# Patient Record
Sex: Male | Born: 1990 | Race: Black or African American | Hispanic: No | Marital: Single | Smoking: Never smoker
Health system: Southern US, Community
[De-identification: ages and names within clinical notes are randomized; demographics above are authoritative.]

---
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
## 2008-05-13 ENCOUNTER — Emergency Department (HOSPITAL_COMMUNITY): Admission: EM | Admit: 2008-05-13 | Discharge: 2008-05-14 | Payer: Self-pay | Admitting: Emergency Medicine

## 2010-08-03 LAB — COMPREHENSIVE METABOLIC PANEL
AST: 28 U/L (ref 0–37)
Albumin: 4.6 g/dL (ref 3.5–5.2)
Alkaline Phosphatase: 62 U/L (ref 39–117)
Calcium: 10.2 mg/dL (ref 8.4–10.5)
Chloride: 105 mEq/L (ref 96–112)
Creatinine, Ser: 0.97 mg/dL (ref 0.4–1.5)
GFR calc Af Amer: >60 mL/min (ref >60)
Glucose, Bld: 111 mg/dL — ABNORMAL HIGH (ref 70–99)
Total Protein: 7.9 g/dL (ref 6.0–8.3)

## 2010-08-03 LAB — URINALYSIS, ROUTINE W REFLEX MICROSCOPIC
Bilirubin Urine: NEGATIVE
Glucose, UA: NEGATIVE mg/dL
Hgb urine dipstick: NEGATIVE
Ketones, ur: NEGATIVE mg/dL
Protein, ur: NEGATIVE mg/dL
pH: 7 (ref 5.0–8.0)

## 2010-08-03 LAB — CBC
Hemoglobin: 17 g/dL (ref 13.0–17.0)
MCHC: 32.9 g/dL (ref 30.0–36.0)
Platelets: 168 10*3/uL (ref 150–400)
RDW: 13.2 % (ref 11.5–15.5)

## 2010-08-03 LAB — AMYLASE: Amylase: 80 U/L (ref 27–131)

## 2010-08-03 LAB — DIFFERENTIAL
Eosinophils Absolute: 0.1 10*3/uL (ref 0.0–0.7)
Lymphs Abs: 0.6 10*3/uL — ABNORMAL LOW (ref 0.7–4.0)
Monocytes Relative: 3 % (ref 3–12)
Neutrophils Relative %: 92 % — ABNORMAL HIGH (ref 43–77)

## 2014-01-15 ENCOUNTER — Encounter (HOSPITAL_COMMUNITY): Payer: Self-pay | Admitting: Emergency Medicine

## 2014-01-15 ENCOUNTER — Emergency Department (HOSPITAL_COMMUNITY)
Admission: EM | Admit: 2014-01-15 | Discharge: 2014-01-15 | Disposition: A | Discharge status: Home / Self Care | Payer: BC Managed Care – PPO | Attending: Emergency Medicine | Admitting: Emergency Medicine

## 2014-01-15 ENCOUNTER — Emergency Department (HOSPITAL_COMMUNITY): Payer: BC Managed Care – PPO

## 2014-01-15 DIAGNOSIS — R1033 Periumbilical pain: Secondary | ICD-10-CM | POA: Insufficient documentation

## 2014-01-15 DIAGNOSIS — R1013 Epigastric pain: Secondary | ICD-10-CM | POA: Insufficient documentation

## 2014-01-15 DIAGNOSIS — Z791 Long term (current) use of non-steroidal anti-inflammatories (NSAID): Secondary | ICD-10-CM | POA: Insufficient documentation

## 2014-01-15 DIAGNOSIS — R112 Nausea with vomiting, unspecified: Secondary | ICD-10-CM | POA: Diagnosis not present

## 2014-01-15 DIAGNOSIS — Z7982 Long term (current) use of aspirin: Secondary | ICD-10-CM | POA: Diagnosis not present

## 2014-01-15 DIAGNOSIS — R51 Headache: Secondary | ICD-10-CM | POA: Insufficient documentation

## 2014-01-15 LAB — URINALYSIS, ROUTINE W REFLEX MICROSCOPIC
Bilirubin Urine: NEGATIVE
GLUCOSE, UA: 100 mg/dL — AB
HGB URINE DIPSTICK: NEGATIVE
KETONES UR: 15 mg/dL — AB
Leukocytes, UA: NEGATIVE
Nitrite: NEGATIVE
PROTEIN: 30 mg/dL — AB
SPECIFIC GRAVITY, URINE: 1.017 (ref 1.005–1.030)
UROBILINOGEN UA: 2 mg/dL — AB (ref 0.0–1.0)
pH: 8 (ref 5.0–8.0)

## 2014-01-15 LAB — CBC WITH DIFFERENTIAL/PLATELET
BASOS ABS: 0 10*3/uL (ref 0.0–0.1)
Basophils Relative: 0 % (ref 0–1)
EOS ABS: 0.1 10*3/uL (ref 0.0–0.7)
Eosinophils Relative: 1 % (ref 0–5)
HEMATOCRIT: 44.2 % (ref 39.0–52.0)
HEMOGLOBIN: 15.5 g/dL (ref 13.0–17.0)
Lymphocytes Relative: 25 % (ref 12–46)
Lymphs Abs: 2.2 10*3/uL (ref 0.7–4.0)
MCH: 29.6 pg (ref 26.0–34.0)
MCHC: 35.1 g/dL (ref 30.0–36.0)
MCV: 84.4 fL (ref 78.0–100.0)
Monocytes Absolute: 0.4 10*3/uL (ref 0.1–1.0)
Monocytes Relative: 5 % (ref 3–12)
Neutro Abs: 5.8 10*3/uL (ref 1.7–7.7)
Neutrophils Relative %: 69 % (ref 43–77)
PLATELETS: 214 10*3/uL (ref 150–400)
RBC: 5.24 MIL/uL (ref 4.22–5.81)
RDW: 12.7 % (ref 11.5–15.5)
WBC: 8.5 10*3/uL (ref 4.0–10.5)

## 2014-01-15 LAB — COMPREHENSIVE METABOLIC PANEL
ALBUMIN: 4.4 g/dL (ref 3.5–5.2)
ALT: 29 U/L (ref 0–53)
AST: 43 U/L — ABNORMAL HIGH (ref 0–37)
Alkaline Phosphatase: 71 U/L (ref 39–117)
Anion gap: 15 (ref 5–15)
BUN: 8 mg/dL (ref 6–23)
CALCIUM: 10.2 mg/dL (ref 8.4–10.5)
CHLORIDE: 101 meq/L (ref 96–112)
CO2: 25 meq/L (ref 19–32)
CREATININE: 0.88 mg/dL (ref 0.50–1.35)
GFR calc Af Amer: >90 mL/min (ref >90)
GLUCOSE: 96 mg/dL (ref 70–99)
Potassium: 3.4 mEq/L — ABNORMAL LOW (ref 3.7–5.3)
SODIUM: 141 meq/L (ref 137–147)
Total Bilirubin: 1.3 mg/dL — ABNORMAL HIGH (ref 0.3–1.2)
Total Protein: 7.8 g/dL (ref 6.0–8.3)

## 2014-01-15 LAB — URINE MICROSCOPIC-ADD ON

## 2014-01-15 LAB — LIPASE, BLOOD: Lipase: 16 U/L (ref 11–59)

## 2014-01-15 MED ORDER — METOCLOPRAMIDE HCL 5 MG/ML IJ SOLN
10.0000 mg | Freq: Once | INTRAMUSCULAR | Status: AC
  Administered 2014-01-15: 10 mg via INTRAVENOUS
  Filled 2014-01-15: qty 2

## 2014-01-15 MED ORDER — SODIUM CHLORIDE 0.9 % IV BOLUS (SEPSIS)
1000.0000 mL | Freq: Once | INTRAVENOUS | Status: AC
  Administered 2014-01-15: 1000 mL via INTRAVENOUS

## 2014-01-15 MED ORDER — ONDANSETRON HCL 4 MG/2ML IJ SOLN
4.0000 mg | Freq: Once | INTRAMUSCULAR | Status: AC
  Administered 2014-01-15: 4 mg via INTRAVENOUS
  Filled 2014-01-15: qty 2

## 2014-01-15 MED ORDER — DIPHENHYDRAMINE HCL 50 MG/ML IJ SOLN
25.0000 mg | Freq: Once | INTRAMUSCULAR | Status: AC
  Administered 2014-01-15: 25 mg via INTRAVENOUS
  Filled 2014-01-15: qty 1

## 2014-01-15 MED ORDER — IOHEXOL 300 MG/ML  SOLN
25.0000 mL | Freq: Once | INTRAMUSCULAR | Status: AC | PRN
  Administered 2014-01-15: 25 mL via ORAL

## 2014-01-15 MED ORDER — IOHEXOL 300 MG/ML  SOLN
100.0000 mL | Freq: Once | INTRAMUSCULAR | Status: AC | PRN
  Administered 2014-01-15: 100 mL via INTRAVENOUS

## 2014-01-15 MED ORDER — ONDANSETRON 4 MG PO TBDP: 4.0000 mg | ORAL_TABLET | Freq: Three times a day (TID) | ORAL | Status: DC | PRN

## 2014-01-15 NOTE — ED Notes (Signed)
Pt tolerated ginger ale.  

## 2014-01-15 NOTE — ED Notes (Signed)
Pt given ginger ale per oders.

## 2014-01-15 NOTE — Discharge Instructions (Signed)
Please follow up with your primary care physician in 1-2 days. If you do not have one please call the [facility] number listed above. Please take Zofran as prescribed. Please read all discharge instructions and return precautions.   Nausea and Vomiting Nausea is a sick feeling that often comes before throwing up (vomiting). Vomiting is a reflex where stomach contents come out of your mouth. Vomiting can cause severe loss of body fluids (dehydration). Children and elderly adults can become dehydrated quickly, especially if they also have diarrhea. Nausea and vomiting are symptoms of a condition or disease. It is important to find the cause of your symptoms. CAUSES   Direct irritation of the stomach lining. This irritation can result from increased acid production (gastroesophageal reflux disease), infection, food poisoning, taking certain medicines (such as nonsteroidal anti-inflammatory drugs), alcohol use, or tobacco use.  Signals from the brain.These signals could be caused by a headache, heat exposure, an inner ear disturbance, increased pressure in the brain from injury, infection, a tumor, or a concussion, pain, emotional stimulus, or metabolic problems.  An obstruction in the gastrointestinal tract (bowel obstruction).  Illnesses such as diabetes, hepatitis, gallbladder problems, appendicitis, kidney problems, cancer, sepsis, atypical symptoms of a heart attack, or eating disorders.  Medical treatments such as chemotherapy and radiation.  Receiving medicine that makes you sleep (general anesthetic) during surgery. DIAGNOSIS Your caregiver may ask for tests to be done if the problems do not improve after a few days. Tests may also be done if symptoms are severe or if the reason for the nausea and vomiting is not clear. Tests may include:  Urine tests.  Blood tests.  Stool tests.  Cultures (to look for evidence of infection).  X-rays or other imaging  studies. Test results can help your caregiver make decisions about treatment or the need for additional tests. TREATMENT You need to stay well hydrated. Drink frequently but in small amounts.You may wish to drink water, sports drinks, clear broth, or eat frozen ice pops or gelatin dessert to help stay hydrated.When you eat, eating slowly may help prevent nausea.There are also some antinausea medicines that may help prevent nausea. HOME CARE INSTRUCTIONS   Take all medicine as directed by your caregiver.  If you do not have an appetite, do not force yourself to eat. However, you must continue to drink fluids.  If you have an appetite, eat a normal diet unless your caregiver tells you differently.  Eat a variety of complex carbohydrates (rice, wheat, potatoes, bread), lean meats, yogurt, fruits, and vegetables.  Avoid high-fat foods because they are more difficult to digest.  Drink enough water and fluids to keep your urine clear or pale yellow.  If you are dehydrated, ask your caregiver for specific rehydration instructions. Signs of dehydration may include:  Severe thirst.  Dry lips and mouth.  Dizziness.  Dark urine.  Decreasing urine frequency and amount.  Confusion.  Rapid breathing or pulse. SEEK IMMEDIATE MEDICAL CARE IF:   You have blood or brown flecks (like coffee grounds) in your vomit.  You have black or bloody stools.  You have a severe headache or stiff neck.  You are confused.  You have severe abdominal pain.  You have chest pain or trouble breathing.  You do not urinate at least once every 8 hours.  You develop cold or clammy skin.  You continue to vomit for longer than 24 to 48 hours.  You have a fever. MAKE SURE YOU:  Understand these instructions.  Will watch your condition.  Will get help right away if you are not doing well or get worse. Document Released: 04/05/2005 Document Revised: 06/28/2011 Document Reviewed:  09/02/2010 [facility] LPExitCare Patient Information 2015 [place], [place]. This information is not intended to replace advice given to you by your health care provider. Make sure you discuss any questions you have with your health care provider.   Abdominal Pain Many things can cause abdominal pain. Usually, abdominal pain is not caused by a disease and will improve without treatment. It can often be observed and treated at home. Your health care provider will do a physical exam and possibly order blood tests and X-rays to help determine the seriousness of your pain. However, in many cases, more time must pass before a clear cause of the pain can be found. Before that point, your health care provider may not know if you need more testing or further treatment. HOME CARE INSTRUCTIONS  Monitor your abdominal pain for any changes. The following actions may help to alleviate any discomfort you are experiencing:  Only take over-the-counter or prescription medicines as directed by your health care provider.  Do not take laxatives unless directed to do so by your health care provider.  Try a clear liquid diet (broth, tea, or water) as directed by your health care provider. Slowly move to a bland diet as tolerated. SEEK MEDICAL CARE IF:  You have unexplained abdominal pain.  You have abdominal pain associated with nausea or diarrhea.  You have pain when you urinate or have a bowel movement.  You experience abdominal pain that wakes you in the night.  You have abdominal pain that is worsened or improved by eating food.  You have abdominal pain that is worsened with eating fatty foods.  You have a fever. SEEK IMMEDIATE MEDICAL CARE IF:   Your pain does not go away within 2 hours.  You keep throwing up (vomiting).  Your pain is felt only in portions of the abdomen, such as the right side or the left lower portion of the abdomen.  You pass bloody or black tarry stools. MAKE SURE YOU:  Understand these  instructions.   Will watch your condition.   Will get help right away if you are not doing well or get worse.  Document Released: 01/13/2005 Document Revised: 04/10/2013 Document Reviewed: 12/13/2012 [facility] Patient Information 2015 [employer], [place]. This information is not intended to replace advice given to you by your health care provider. Make sure you discuss any questions you have with your health care provider.

## 2014-01-15 NOTE — ED Notes (Signed)
Pt c/o N/v x 4 days; pt sts HA worse with bright light as well

## 2014-01-15 NOTE — ED Provider Notes (Signed)
CSN: [id number]     Arrival date & time 01/15/14  1250 History   First MD Initiated Contact with Patient 01/15/14 1514     Chief Complaint  Patient presents with  . Emesis  . Headache     (Consider location/radiation/quality/duration/timing/severity/associated sxs/prior Treatment) HPI Comments: Patient is an otherwise healthy 23 year old male presenting to the emergency room for 4 days of nausea, multiple episodes of nonbloody nonbilious emesis with intermittent episodes of generalized tonic headache with associated photophobia. Patient is not currently complaining of a headache at this time. He states he has been unable to tolerate any by mouth intake. This been unable to tolerate any medications. Denies any fevers, chills, diarrhea constipation, urinary symptoms. No abdominal surgical history.   Patient is a 23 y.o. male presenting with vomiting and headaches.  Emesis Associated symptoms: abdominal pain and headaches   Associated symptoms: no chills   Headache Associated symptoms: abdominal pain, nausea and vomiting   Associated symptoms: no fever     History reviewed. No pertinent past medical history. History reviewed. No pertinent past surgical history. History reviewed. No pertinent family history. History  Substance Use Topics  . Smoking status: Never Smoker   . Smokeless tobacco: Not on file  . Alcohol Use: No    Review of Systems  Constitutional: Negative for fever and chills.  Gastrointestinal: Positive for nausea, vomiting and abdominal pain.  Neurological: Positive for headaches.  All other systems reviewed and are negative.     Allergies  Review of patient's allergies indicates no known allergies.  Home Medications   Prior to Admission medications   Medication Sig Start Date End Date Taking? Authorizing Provider  ASPIRIN PO Take 2 tablets by mouth daily as needed (for pain).   Yes Historical Provider, MD  ibuprofen (ADVIL,MOTRIN) 200 MG tablet Take 400 mg  by mouth every 6 (six) hours as needed for moderate pain.   Yes Historical Provider, MD  ondansetron (ZOFRAN ODT) 4 MG disintegrating tablet Take 1 tablet (4 mg total) by mouth every 8 (eight) hours as needed for nausea or vomiting. 01/15/14   Victorino Dike L Oleva Koo, PA-C   BP 119/75  Pulse 63  Temp(Src) 98.5 F (36.9 C) (Oral)  Resp 17  Ht 5\' 2"  (1.575 m)  Wt 134 lb (60.782 kg)  BMI 24.50 kg/m2  SpO2 100% Physical Exam  Nursing note and vitals reviewed. Constitutional: He is oriented to person, place, and time. He appears well-developed and well-nourished. No distress.  HENT:  Head: Normocephalic and atraumatic.  Right Ear: External ear normal.  Left Ear: External ear normal.  Nose: Nose normal.  Mouth/Throat: Oropharynx is clear and moist. No oropharyngeal exudate.  Eyes: Conjunctivae and EOM are normal. Pupils are equal, round, and reactive to light.  Neck: Normal range of motion. Neck supple.  Cardiovascular: Normal rate, regular rhythm, normal heart sounds and intact distal pulses.   Pulmonary/Chest: Effort normal and breath sounds normal. No respiratory distress.  Abdominal: Soft. Normal appearance and bowel sounds are normal. He exhibits no distension. There is tenderness in the epigastric area and periumbilical area. There is no rigidity, no rebound, no guarding and no CVA tenderness.  Musculoskeletal: Normal range of motion. He exhibits no edema.  Neurological: He is alert and oriented to person, place, and time. He has normal strength. No cranial nerve deficit. Gait normal. GCS eye subscore is 4. GCS verbal subscore is 5. GCS motor subscore is 6.  Sensation grossly intact.  No pronator drift.  Bilateral heel-knee-shin intact.  Skin: Skin is warm and dry. He is not diaphoretic.    ED Course  Procedures (including critical care time) Medications  sodium chloride 0.9 % bolus 1,000 mL (0 mLs Intravenous Stopped 01/15/14 1831)  ondansetron (ZOFRAN) injection 4 mg (4 mg  Intravenous Given 01/15/14 1639)  metoCLOPramide (REGLAN) injection 10 mg (10 mg Intravenous Given 01/15/14 1641)  diphenhydrAMINE (BENADRYL) injection 25 mg (25 mg Intravenous Given 01/15/14 1640)  iohexol (OMNIPAQUE) 300 MG/ML solution 25 mL (25 mLs Oral Contrast Given 01/15/14 1635)  iohexol (OMNIPAQUE) 300 MG/ML solution 100 mL (100 mLs Intravenous Contrast Given 01/15/14 1800)    Labs Review Labs Reviewed  COMPREHENSIVE METABOLIC PANEL - Abnormal; Notable for the following:    Potassium 3.4 (*)    AST 43 (*)    Total Bilirubin 1.3 (*)    All other components within normal limits  URINALYSIS, ROUTINE W REFLEX MICROSCOPIC - Abnormal; Notable for the following:    APPearance HAZY (*)    Glucose, UA 100 (*)    Ketones, ur 15 (*)    Protein, ur 30 (*)    Urobilinogen, UA 2.0 (*)    All other components within normal limits  URINE MICROSCOPIC-ADD ON - Abnormal; Notable for the following:    Bacteria, UA MANY (*)    All other components within normal limits  URINE CULTURE  CBC WITH DIFFERENTIAL  LIPASE, BLOOD    Imaging Review Ct Abdomen Pelvis W Contrast  01/15/2014   CLINICAL DATA:  Nausea and vomiting for 4 days.  Headache.  EXAM: CT ABDOMEN AND PELVIS WITH CONTRAST  TECHNIQUE: Multidetector CT imaging of the abdomen and pelvis was performed using the standard protocol following bolus administration of intravenous contrast.  CONTRAST:  100mL OMNIPAQUE IOHEXOL 300 MG/ML  SOLN  COMPARISON:  Abdominal pelvic CT 05/13/2008.  FINDINGS: Lung bases: Clear. No significant pleural or pericardial effusion.  Liver/Biliary/Pancreas: The liver is normal in density without focal abnormality. No evidence of gallstones, gallbladder wall thickening or biliary dilatation. The pancreas appears normal.  Spleen/Adrenal glands: Unremarkable.  Kidneys/Ureters/Bladder: Both kidneys appear normal. There is no hydronephrosis or evidence of urinary tract calculus. The bladder appears normal.  Bowel/Peritoneum: The  stomach, small bowel, appendix and colon demonstrate no significant findings. There is no ascites or extraluminal fluid collection.  Retroperitoneum/Pelvis: There are no enlarged abdominal or pelvic lymph nodes. Small mesenteric lymph nodes are likely reactive. There are no significant vascular findings. The prostate gland and seminal vesicles appear normal.  Abdominal wall: No abdominal wall masses or hernias.  Musculoskeletal: No acute or significant osseus findings.  IMPRESSION: 1. No definite acute abdominal pelvic findings. No evidence of appendicitis or bowel obstruction. 2. Mildly prominent mesenteric lymph nodes, potentially reflecting mild mesenteric adenitis.   Electronically Signed   By: Roxy HorsemanBill  Veazey M.D.   On: 01/15/2014 18:22     EKG Interpretation None      MDM   Final diagnoses:  Nausea and vomiting in adult patient  Periumbilical abdominal pain   Filed Vitals:   01/15/14 1930  BP: 119/75  Pulse: 63  Temp:   Resp: 17   Afebrile, NAD, non-toxic appearing, AAOx4. Patient is nontoxic, nonseptic appearing, in no apparent distress.  Patient's pain and other symptoms adequately managed in emergency department.  Fluid bolus given.  Labs, imaging and vitals reviewed.  Patient does not meet the SIRS or Sepsis criteria.  On repeat exam patient does not have a surgical abdomin and there are no peritoneal signs.  No indication  of appendicitis, bowel obstruction, bowel perforation, cholecystitis, diverticulitis.  Patient discharged home with symptomatic treatment and given strict instructions for follow-up with their primary care physician.  I have also discussed reasons to return immediately to the ER.  Patient expresses understanding and agrees with plan. Patient is stable at time of discharge         Jeannetta Ellis, PA-C 01/15/14 2025

## 2014-01-16 LAB — URINE CULTURE
Colony Count: NO GROWTH
Culture: NO GROWTH

## 2014-01-16 NOTE — ED Provider Notes (Signed)
Medical screening examination/treatment/procedure(s) were performed by non-physician practitioner and as supervising physician I was immediately available for consultation/collaboration.    Vida RollerBrian D Meshia Rau, MD 01/16/14 [phone]

## 2014-07-06 ENCOUNTER — Emergency Department (HOSPITAL_COMMUNITY): Payer: Self-pay

## 2014-07-06 ENCOUNTER — Encounter (HOSPITAL_COMMUNITY): Payer: Self-pay

## 2014-07-06 ENCOUNTER — Emergency Department (HOSPITAL_COMMUNITY)
Admission: EM | Admit: 2014-07-06 | Discharge: 2014-07-06 | Disposition: A | Discharge status: Home / Self Care | Payer: Self-pay | Attending: Emergency Medicine | Admitting: Emergency Medicine

## 2014-07-06 DIAGNOSIS — K047 Periapical abscess without sinus: Secondary | ICD-10-CM | POA: Insufficient documentation

## 2014-07-06 LAB — BASIC METABOLIC PANEL
Anion gap: 10 (ref 5–15)
BUN: 11 mg/dL (ref 6–23)
CALCIUM: 9.3 mg/dL (ref 8.4–10.5)
CHLORIDE: 107 mmol/L (ref 96–112)
CO2: 23 mmol/L (ref 19–32)
CREATININE: 0.78 mg/dL (ref 0.50–1.35)
GFR calc Af Amer: >90 mL/min (ref >90)
GFR calc non Af Amer: >90 mL/min (ref >90)
Glucose, Bld: 93 mg/dL (ref 70–99)
POTASSIUM: 3.6 mmol/L (ref 3.5–5.1)
SODIUM: 140 mmol/L (ref 135–145)

## 2014-07-06 LAB — CBC WITH DIFFERENTIAL/PLATELET
BASOS ABS: 0 10*3/uL (ref 0.0–0.1)
BASOS PCT: 0 % (ref 0–1)
Eosinophils Absolute: 0.1 10*3/uL (ref 0.0–0.7)
Eosinophils Relative: 1 % (ref 0–5)
HCT: 44.1 % (ref 39.0–52.0)
Hemoglobin: 15.6 g/dL (ref 13.0–17.0)
Lymphocytes Relative: 16 % (ref 12–46)
Lymphs Abs: 1.6 10*3/uL (ref 0.7–4.0)
MCH: 29.9 pg (ref 26.0–34.0)
MCHC: 35.4 g/dL (ref 30.0–36.0)
MCV: 84.6 fL (ref 78.0–100.0)
Monocytes Absolute: 0.7 10*3/uL (ref 0.1–1.0)
Monocytes Relative: 7 % (ref 3–12)
NEUTROS ABS: 7.4 10*3/uL (ref 1.7–7.7)
Neutrophils Relative %: 76 % (ref 43–77)
PLATELETS: 160 10*3/uL (ref 150–400)
RBC: 5.21 MIL/uL (ref 4.22–5.81)
RDW: 13.2 % (ref 11.5–15.5)
WBC: 9.8 10*3/uL (ref 4.0–10.5)

## 2014-07-06 MED ORDER — IOHEXOL 300 MG/ML  SOLN
100.0000 mL | Freq: Once | INTRAMUSCULAR | Status: AC | PRN
  Administered 2014-07-06: 100 mL via INTRAVENOUS

## 2014-07-06 MED ORDER — FENTANYL CITRATE 0.05 MG/ML IJ SOLN
100.0000 ug | INTRAMUSCULAR | Status: DC | PRN
  Administered 2014-07-06 (×2): 100 ug via INTRAVENOUS
  Filled 2014-07-06 (×2): qty 2

## 2014-07-06 MED ORDER — FENTANYL CITRATE 0.05 MG/ML IJ SOLN: 10.0000 ug | INTRAMUSCULAR | Status: DC | PRN

## 2014-07-06 MED ORDER — DEXTROSE 5 % IV SOLN
1.0000 g | Freq: Once | INTRAVENOUS | Status: AC
  Administered 2014-07-06: 1 g via INTRAVENOUS
  Filled 2014-07-06: qty 10

## 2014-07-06 MED ORDER — ONDANSETRON HCL 4 MG/2ML IJ SOLN
4.0000 mg | Freq: Once | INTRAMUSCULAR | Status: AC
  Administered 2014-07-06: 4 mg via INTRAVENOUS
  Filled 2014-07-06: qty 2

## 2014-07-06 MED ORDER — SODIUM CHLORIDE 0.9 % IV BOLUS (SEPSIS)
500.0000 mL | Freq: Once | INTRAVENOUS | Status: AC
  Administered 2014-07-06: 500 mL via INTRAVENOUS

## 2014-07-06 MED ORDER — SODIUM CHLORIDE 0.9 % IV SOLN
INTRAVENOUS | Status: DC
  Administered 2014-07-06: 11:00:00 via INTRAVENOUS

## 2014-07-06 MED ORDER — CLINDAMYCIN HCL 300 MG PO CAPS: 300.0000 mg | ORAL_CAPSULE | Freq: Four times a day (QID) | ORAL | Status: AC

## 2014-07-06 MED ORDER — OXYCODONE-ACETAMINOPHEN 5-325 MG PO TABS: 1.0000 | ORAL_TABLET | ORAL | Status: DC | PRN

## 2014-07-06 NOTE — Discharge Instructions (Signed)
Your pain and difficulty swallowing is related to a dental infection.  You will need to see the surgeon, for treatment, in 2 days. Do not drink or eat anything after midnight on Sunday night to prepare for the tooth extraction on Monday.  Here, if needed, for problems such as unable to swallow medications, fever, vomiting, or increasing pain not controlled by your medications.

## 2014-07-06 NOTE — ED Provider Notes (Signed)
CSN: [id number]     Arrival date & time 07/06/14  4098 History   First MD Initiated Contact with Patient 07/06/14 1021     Chief Complaint  Patient presents with  . Jaw Pain     (Consider location/radiation/quality/duration/timing/severity/associated sxs/prior Treatment) HPI   Edwin Miranda is a 24 y.o. male who presents for evaluation of left jaw pain, 3 days, with inability to eat, and pain with opening mouth and swallowing. No recent dental care. He denies fever, chills, nausea, vomiting, weakness or dizziness. There are no other known modifying factors.   History reviewed. No pertinent past medical history. History reviewed. No pertinent past surgical history. History reviewed. No pertinent family history. History  Substance Use Topics  . Smoking status: Never Smoker   . Smokeless tobacco: Not on file  . Alcohol Use: No    Review of Systems  All other systems reviewed and are negative.     Allergies  Review of patient's allergies indicates no known allergies.  Home Medications   Prior to Admission medications   Medication Sig Start Date End Date Taking? Authorizing Provider  ibuprofen (ADVIL,MOTRIN) 200 MG tablet Take 400 mg by mouth every 6 (six) hours as needed for moderate pain.   Yes Historical Provider, MD  clindamycin (CLEOCIN) 300 MG capsule Take 1 capsule (300 mg total) by mouth 4 (four) times daily. X 7 days 07/06/14   Mancel Bale, MD  ondansetron (ZOFRAN ODT) 4 MG disintegrating tablet Take 1 tablet (4 mg total) by mouth every 8 (eight) hours as needed for nausea or vomiting. Patient not taking: Reported on 07/06/2014 01/15/14   Francee Piccolo, PA-C  oxyCODONE-acetaminophen (PERCOCET) 5-325 MG per tablet Take 1 tablet by mouth every 4 (four) hours as needed for severe pain. 07/06/14   Mancel Bale, MD   BP 123/73 mmHg  Pulse 70  Temp(Src) 98.9 F (37.2 C) (Oral)  Resp 16  SpO2 99% Physical Exam  Constitutional: He is oriented to person, place,  and time. He appears well-developed and well-nourished.  HENT:  Head: Normocephalic and atraumatic.  Right Ear: External ear normal.  Left Ear: External ear normal.  He has mild trismus. However, with encouragement, he is able to open his mouth, about 50%. There is mild left pharyngeal swelling. No tonsillar hypertrophy or exudate. Left lower molar is carious, to the base, with mild localized swelling, of the alveolar ridge. There is tenderness of the left mandibular region, with indistinct swelling, primarily beneath the mandible. There is no significant palpable mandibular region adenopathy on the left.  Eyes: Conjunctivae and EOM are normal. Pupils are equal, round, and reactive to light.  Neck: Normal range of motion and phonation normal. Neck supple.  Cardiovascular: Normal rate, regular rhythm and normal heart sounds.   Pulmonary/Chest: Effort normal and breath sounds normal. He exhibits no bony tenderness.  Abdominal: Soft. There is no tenderness.  Musculoskeletal: Normal range of motion.  Neurological: He is alert and oriented to person, place, and time. No cranial nerve deficit or sensory deficit. He exhibits normal muscle tone. Coordination normal.  Skin: Skin is warm, dry and intact.  Psychiatric: He has a normal mood and affect. His behavior is normal. Judgment and thought content normal.  Nursing note and vitals reviewed.   ED Course  Procedures (including critical care time)  Medications  0.9 %  sodium chloride infusion ( Intravenous New Bag/Given 07/06/14 1110)  fentaNYL (SUBLIMAZE) injection 100 mcg (100 mcg Intravenous Given 07/06/14 1317)  sodium chloride 0.9 %  bolus 500 mL (0 mLs Intravenous Stopped 07/06/14 1110)  ondansetron (ZOFRAN) injection 4 mg (4 mg Intravenous Given 07/06/14 1107)  iohexol (OMNIPAQUE) 300 MG/ML solution 100 mL (100 mLs Intravenous Contrast Given 07/06/14 1217)  cefTRIAXone (ROCEPHIN) 1 g in dextrose 5 % 50 mL IVPB (0 g Intravenous Stopped 07/06/14  1634)    Patient Vitals for the past 24 hrs:  BP Temp Temp src Pulse Resp SpO2  07/06/14 1633 123/73 mmHg 98.9 F (37.2 C) Oral 70 16 99 %  07/06/14 1538 115/66 mmHg 99 F (37.2 C) Oral (!) 50 15 100 %  07/06/14 1313 122/79 mmHg 98.8 F (37.1 C) Oral 66 16 100 %  07/06/14 0951 125/80 mmHg 98.2 F (36.8 C) Oral 60 17 100 %   Case discussed with on-call oral surgery, Dr. Jeanice Lim, he agrees patient can be discharged and follow-up with him in the office, in 2 days.   At discharge- Reevaluation with update and discussion. After initial assessment and treatment, an updated evaluation reveals he is comfortable. Findings discussed with patient, and mother, all questions answered. Tuwanda Vokes L   2:49 PM-Consult complete with Dr. Jeanice Lim, ENT. Patient case explained and discussed. He agrees to see patient for further evaluation and treatment, in 2 days. Call ended at 15:05  Labs Review Labs Reviewed  CBC WITH DIFFERENTIAL/PLATELET  BASIC METABOLIC PANEL    Imaging Review Ct Maxillofacial W/cm  07/06/2014   CLINICAL DATA:  Left elbow pain, left pharyngeal swelling.  EXAM: CT MAXILLOFACIAL WITH CONTRAST  TECHNIQUE: Multidetector CT imaging of the maxillofacial structures was performed with intravenous contrast. Multiplanar CT image reconstructions were also generated. A small metallic BB was placed on the right temple in order to reliably differentiate right from left.  CONTRAST:  OMNIPAQUE IOHEXOL 300 MG/ML  SOLN  COMPARISON:  None.  FINDINGS: Swelling of the parapharyngeal mucosa with indistinct planes between the lingual and palatine tonsils. The aryepiglottic fold is thickened and obscured on the left (image 33, series 3). The retropharyngeal tissues are mildly edematous. There is no evidence of abscess or organized fluid collection within this pharyngeal mucosal thickening. The airway is patent but narrowed circumferential pharyngeal thickening to 6 mm by a 8 mm in axial dimension (image  43, series 3)  The parotid glands are normal. The submandibular glands are normal. No clear evidence of dental abscess image all.  Paranasal sinuses are clear. The orbits are normal. Limited view of the inferior brain is unremarkable.  The glottis is normal.  The epiglottis is normal.  IMPRESSION: 1. Circumferential pharyngeal edema and thickening in the posterior oral pharynx. This is more prominent on the left and associated with the left palatini tonsil with obscuration of the left aryepiglottic fold 2. No discrete abscess or fluid collection. 3. The airway is narrowed through the posterior oralpharynx but patent.   Electronically Signed   By: Genevive Bi M.D.   On: 07/06/2014 13:06     EKG Interpretation None      MDM   Final diagnoses:  Dental infection     Dental infection complicated by mild localized swelling without significant involvement of airway, or systemic infection. Patient improved with treatment stable for discharge.  Nursing Notes Reviewed/ Care Coordinated Applicable Imaging Reviewed Interpretation of Laboratory Data incorporated into ED treatment  The patient appears reasonably screened and/or stabilized for discharge and I doubt any other medical condition or other [facility] requiring further screening, evaluation, or treatment in the ED at this time prior to discharge.  Plan: Home Medications- Clindamycin; Home Treatments- rest, push fluids; return here if the recommended treatment, does not improve the symptoms; Recommended follow up- Oral Surgery 2 days, return here prn     Mancel BaleElliott Geanie Pacifico, MD 07/06/14 1646

## 2014-07-06 NOTE — ED Notes (Signed)
Pt states jaw pain since Thursday.  Now decreased ability to open mouth.  Unable to eat.  Does not recall any dental pain prior to event.  Facial swelling.

## 2014-07-24 ENCOUNTER — Encounter (HOSPITAL_COMMUNITY): Payer: Self-pay | Admitting: Emergency Medicine

## 2014-07-24 ENCOUNTER — Emergency Department (HOSPITAL_COMMUNITY)
Admission: EM | Admit: 2014-07-24 | Discharge: 2014-07-24 | Disposition: A | Discharge status: Home / Self Care | Payer: Self-pay | Attending: Emergency Medicine | Admitting: Emergency Medicine

## 2014-07-24 DIAGNOSIS — Z23 Encounter for immunization: Secondary | ICD-10-CM | POA: Insufficient documentation

## 2014-07-24 DIAGNOSIS — Z792 Long term (current) use of antibiotics: Secondary | ICD-10-CM | POA: Insufficient documentation

## 2014-07-24 DIAGNOSIS — Y9389 Activity, other specified: Secondary | ICD-10-CM | POA: Insufficient documentation

## 2014-07-24 DIAGNOSIS — Y990 Civilian activity done for income or pay: Secondary | ICD-10-CM | POA: Insufficient documentation

## 2014-07-24 DIAGNOSIS — Y9289 Other specified places as the place of occurrence of the external cause: Secondary | ICD-10-CM | POA: Insufficient documentation

## 2014-07-24 DIAGNOSIS — S61219A Laceration without foreign body of unspecified finger without damage to nail, initial encounter: Secondary | ICD-10-CM

## 2014-07-24 DIAGNOSIS — W278XXA Contact with other nonpowered hand tool, initial encounter: Secondary | ICD-10-CM | POA: Insufficient documentation

## 2014-07-24 DIAGNOSIS — S61213A Laceration without foreign body of left middle finger without damage to nail, initial encounter: Secondary | ICD-10-CM | POA: Insufficient documentation

## 2014-07-24 DIAGNOSIS — Z79899 Other long term (current) drug therapy: Secondary | ICD-10-CM | POA: Insufficient documentation

## 2014-07-24 MED ORDER — TETANUS-DIPHTH-ACELL PERTUSSIS 5-2.5-18.5 LF-MCG/0.5 IM SUSP
0.5000 mL | Freq: Once | INTRAMUSCULAR | Status: AC
  Administered 2014-07-24: 0.5 mL via INTRAMUSCULAR
  Filled 2014-07-24: qty 0.5

## 2014-07-24 NOTE — ED Notes (Signed)
Pt cut left middle finger at work with knife, has small lac to middle knuckle, bleeding controlled with guaze.

## 2014-07-24 NOTE — Discharge Instructions (Signed)

## 2014-07-24 NOTE — ED Provider Notes (Signed)
CSN: [id number]     Arrival date & time 07/24/14  1622 History  This chart was scribed for non-physician practitioner, Teressa Lower, NP, working with Raeford Razor, MD, by Lionel December, ED Scribe. This patient was seen in room WTR5/WTR5 and the patient's care was started at 4:35 PM.    Chief Complaint  Patient presents with  . Finger Injury     (Consider location/radiation/quality/duration/timing/severity/associated sxs/prior Treatment) The history is provided by the patient. No language interpreter was used.    HPI Comments: Edwin Miranda is a 24 y.o. male who presents to the Emergency Department complaining of cut on his left middle finger from a box cutter which occurred while he was at work prior to arrival.  Patient is not able to recall his last tetanus shot.  Patient denies any numbness and has no history of medical conditions.  Pt denies problems with movement of digit.He has no other complaints today.   No past medical history on file. No past surgical history on file. No family history on file. History  Substance Use Topics  . Smoking status: Never Smoker   . Smokeless tobacco: Not on file  . Alcohol Use: No    Review of Systems  Skin: Positive for wound.  All other systems reviewed and are negative.     Allergies  Review of patient's allergies indicates no known allergies.  Home Medications   Prior to Admission medications   Medication Sig Start Date End Date Taking? Authorizing Provider  clindamycin (CLEOCIN) 300 MG capsule Take 1 capsule (300 mg total) by mouth 4 (four) times daily. X 7 days 07/06/14   Mancel Bale, MD  ibuprofen (ADVIL,MOTRIN) 200 MG tablet Take 400 mg by mouth every 6 (six) hours as needed for moderate pain.    Historical Provider, MD  ondansetron (ZOFRAN ODT) 4 MG disintegrating tablet Take 1 tablet (4 mg total) by mouth every 8 (eight) hours as needed for nausea or vomiting. Patient not taking: Reported on 07/06/2014 01/15/14   Francee Piccolo, PA-C  oxyCODONE-acetaminophen (PERCOCET) 5-325 MG per tablet Take 1 tablet by mouth every 4 (four) hours as needed for severe pain. 07/06/14   Mancel Bale, MD   BP 138/82 mmHg  Pulse 74  Temp(Src) 98.5 F (36.9 C) (Oral)  SpO2 100% Physical Exam  Constitutional: He is oriented to person, place, and time. He appears well-developed and well-nourished. No distress.  HENT:  Head: Normocephalic and atraumatic.  Eyes: Conjunctivae and EOM are normal.  Neck: Neck supple.  Cardiovascular: Normal rate.   Pulmonary/Chest: Effort normal. No respiratory distress.  Musculoskeletal: Normal range of motion.  Superficial laceration to the left middle digit.   Neurological: He is alert and oriented to person, place, and time.  Skin: Skin is warm and dry.  Psychiatric: He has a normal mood and affect. His behavior is normal.  Nursing note and vitals reviewed.   ED Course  LACERATION REPAIR Date/Time: 07/24/2014 4:53 PM Performed by: Teressa Lower Authorized by: Teressa Lower Consent: Verbal consent obtained. Risks and benefits: risks, benefits and alternatives were discussed Consent given by: patient Patient identity confirmed: verbally with patient Body area: upper extremity Location details: left long finger Laceration length: 1.5 cm Foreign bodies: no foreign bodies Irrigation solution: tap water Skin closure: glue Technique: simple Approximation: close Approximation difficulty: simple Patient tolerance: Patient tolerated the procedure well with no immediate complications   (including critical care time) DIAGNOSTIC STUDIES: Oxygen Saturation is 100% on RA, normal by my interpretation.  COORDINATION OF CARE: 4:37 PM Discussed treatment plan with patient at beside, the patient agrees with the plan and has no further questions at this time.   Labs Review Labs Reviewed - No data to display  Imaging Review No results found.   EKG Interpretation None       MDM   Final diagnoses:  Finger laceration, initial encounter   Tetanus updated. dermabond placed to the area. Finger neurovascularly intact  I personally performed the services described in this documentation, which was scribed in my presence. The recorded information has been reviewed and is accurate.    Teressa LowerVrinda Iver Miklas, NP 07/24/14 1654  Raeford RazorStephen Kohut, MD 07/24/14 [phone]

## 2017-01-08 ENCOUNTER — Emergency Department (HOSPITAL_BASED_OUTPATIENT_CLINIC_OR_DEPARTMENT_OTHER)
Admission: EM | Admit: 2017-01-08 | Discharge: 2017-01-08 | Disposition: A | Discharge status: Home / Self Care | Payer: Self-pay | Attending: Emergency Medicine | Admitting: Emergency Medicine

## 2017-01-08 ENCOUNTER — Encounter (HOSPITAL_BASED_OUTPATIENT_CLINIC_OR_DEPARTMENT_OTHER): Payer: Self-pay | Admitting: *Deleted

## 2017-01-08 DIAGNOSIS — S61012A Laceration without foreign body of left thumb without damage to nail, initial encounter: Secondary | ICD-10-CM | POA: Insufficient documentation

## 2017-01-08 DIAGNOSIS — Y929 Unspecified place or not applicable: Secondary | ICD-10-CM | POA: Insufficient documentation

## 2017-01-08 DIAGNOSIS — W260XXA Contact with knife, initial encounter: Secondary | ICD-10-CM | POA: Insufficient documentation

## 2017-01-08 DIAGNOSIS — Y990 Civilian activity done for income or pay: Secondary | ICD-10-CM | POA: Insufficient documentation

## 2017-01-08 DIAGNOSIS — Z79899 Other long term (current) drug therapy: Secondary | ICD-10-CM | POA: Insufficient documentation

## 2017-01-08 DIAGNOSIS — Y939 Activity, unspecified: Secondary | ICD-10-CM | POA: Insufficient documentation

## 2017-01-08 NOTE — ED Triage Notes (Signed)
Pt states he was cutting a box with box cutter yesterday and cut his left hand thumb. Last tetanus unknown.

## 2017-01-08 NOTE — ED Provider Notes (Signed)
[facility] Provider Note   CSN: [id number] Arrival date & time: 01/08/17  1803     History   Chief Complaint Chief Complaint  Patient presents with  . Extremity Laceration    HPI Edwin Miranda is a 26 y.o. male.  The history is provided by the patient and medical records.     26 y.o. M here with left thumb laceration that occurred yesterday.  States he was at work and accidentally cut his finger with a box cutter.  States thumb is now very sore.  He denies numbness/weakness.  He is right hand dominant.  Last tetanus was 3 years ago.  He is requesting a splint because he keeps hitting his finger against stuff at work.  History reviewed. No pertinent past medical history.  There are no active problems to display for this patient.   History reviewed. No pertinent surgical history.     Home Medications    Prior to Admission medications   Medication Sig Start Date End Date Taking? Authorizing Provider  clindamycin (CLEOCIN) 300 MG capsule Take 1 capsule (300 mg total) by mouth 4 (four) times daily. X 7 days 07/06/14   Mancel Bale, MD  ibuprofen (ADVIL,MOTRIN) 200 MG tablet Take 400 mg by mouth every 6 (six) hours as needed for moderate pain.    [provider]  ondansetron (ZOFRAN ODT) 4 MG disintegrating tablet Take 1 tablet (4 mg total) by mouth every 8 (eight) hours as needed for nausea or vomiting. Patient not taking: Reported on 07/06/2014 01/15/14   Piepenbrink, Victorino Dike, PA-C  oxyCODONE-acetaminophen (PERCOCET) 5-325 MG per tablet Take 1 tablet by mouth every 4 (four) hours as needed for severe pain. 07/06/14   Mancel Bale, MD    Family History No family history on file.  Social History Social History  Substance Use Topics  . Smoking status: Never Smoker  . Smokeless tobacco: Not on file  . Alcohol use No     Allergies   Patient has no known allergies.   Review of Systems Review of Systems  Skin: Positive for wound.  All other  systems reviewed and are negative.    Physical Exam Updated Vital Signs BP (!) 138/93 (BP Location: Left Arm)   Pulse 66   Temp 97.9 F (36.6 C) (Oral)   Resp 16   Ht  (1.6 m)   Wt 68 kg (150 lb)   SpO2 99%   BMI 26.57 kg/m   Physical Exam  Constitutional: He is oriented to person, place, and time. He appears well-developed and well-nourished.  HENT:  Head: Normocephalic and atraumatic.  Mouth/Throat: Oropharynx is clear and moist.  Eyes: Pupils are equal, round, and reactive to light. Conjunctivae and EOM are normal.  Neck: Normal range of motion.  Cardiovascular: Normal rate, regular rhythm and normal heart sounds.   Pulmonary/Chest: Effort normal and breath sounds normal.  Abdominal: Soft. Bowel sounds are normal.  Musculoskeletal: Normal range of motion.  2cm laceration at tip of left thumb that is very superficial, wound is scabbed over, no bleeding, no swelling or drainage, no bony deformities, normal sensation and cap refill, nail intact  Neurological: He is alert and oriented to person, place, and time.  Skin: Skin is warm and dry.  Psychiatric: He has a normal mood and affect.  Nursing note and vitals reviewed.    ED Treatments / Results  Labs (all labs ordered are listed, but only abnormal results are displayed) Labs Reviewed - No data to display  EKG  EKG Interpretation None       Radiology No results found.  Procedures Procedures (including critical care time)  Medications Ordered in ED Medications - No data to display   Initial Impression / Assessment and Plan / ED Course  I have reviewed the triage vital signs and the nursing notes.  Pertinent labs & imaging results that were available during my care of the patient were reviewed by me and considered in my medical decision making (see chart for details).  26 year old male here with laceration of left thumb. This occurred over 24 hours ago. Wound is overall superficial and well scabbed  over at this time. There is no bleeding, drainage, erythema, or other signs of infection. His tetanus is up-to-date. Discussed with patient that given wound is been present for over 24 hours, will be left open to close by secondary intention.  Discussed home care. Splint was provided. Can follow-up with PCP.  Discussed plan with patient, he acknowledged understanding and agreed with plan of care.  Return precautions given for new or worsening symptoms.  Final Clinical Impressions(s) / ED Diagnoses   Final diagnoses:  Laceration of left thumb without foreign body without damage to nail, initial encounter    New Prescriptions Discharge Medication List as of 01/08/2017  7:30 PM       Garlon Hatchet, PA-C 01/08/17 2011    Derwood Kaplan, MD 01/09/17 [phone]

## 2017-01-08 NOTE — ED Notes (Signed)
PMS intact before and after. Pt tolerated well. All questions answered. 

## 2017-01-08 NOTE — Discharge Instructions (Signed)
Keep thumb clean with soap and warm water. Monitor for any signs of infection-- redness, swelling, drainage, fever, etc. Return here for any acute changes.

## 2017-01-08 NOTE — ED Notes (Signed)
Pt reports to RN his last tetanus shot was 3 years ago.

## 2017-01-11 ENCOUNTER — Encounter (HOSPITAL_COMMUNITY): Payer: Self-pay | Admitting: Emergency Medicine

## 2017-01-11 ENCOUNTER — Emergency Department (HOSPITAL_COMMUNITY)
Admission: EM | Admit: 2017-01-11 | Discharge: 2017-01-11 | Disposition: A | Discharge status: Home / Self Care | Payer: Self-pay | Attending: Emergency Medicine | Admitting: Emergency Medicine

## 2017-01-11 DIAGNOSIS — R200 Anesthesia of skin: Secondary | ICD-10-CM

## 2017-01-11 DIAGNOSIS — R202 Paresthesia of skin: Secondary | ICD-10-CM | POA: Insufficient documentation

## 2017-01-11 NOTE — ED Provider Notes (Signed)
[facility]-EMER[facility]ENCY DEPT Provider Note   CSN: [id number] Arrival date & time: 01/11/17  1759     History   Chief Complaint Chief Complaint  Patient presents with  . Finger Injury    HPI Edwin Miranda is a 26 y.o. male who presents with left thumb numbness, pain, swelling. No significant PMH. He states that he accidentally cut his thumb with a box cutter last Friday. He was seen at [facility] on 9/22 and requested a finger splint because he kept hitting his thumb while at work. This was provided. He states that since then he has had ongoing pain, thumb swelling, and numbness at the tip of his thumb. The pain radiates up to his elbow and is worse while at work because of heavy lifting.   HPI  History reviewed. No pertinent past medical history.  There are no active problems to display for this patient.   History reviewed. No pertinent surgical history.     Home Medications    Prior to Admission medications   Medication Sig Start Date End Date Taking? Authorizing Provider  clindamycin (CLEOCIN) 300 MG capsule Take 1 capsule (300 mg total) by mouth 4 (four) times daily. X 7 days 07/06/14   Mancel Bale, MD  ibuprofen (ADVIL,MOTRIN) 200 MG tablet Take 400 mg by mouth every 6 (six) hours as needed for moderate pain.    [provider]  ondansetron (ZOFRAN ODT) 4 MG disintegrating tablet Take 1 tablet (4 mg total) by mouth every 8 (eight) hours as needed for nausea or vomiting. Patient not taking: Reported on 07/06/2014 01/15/14   Piepenbrink, Victorino Dike, PA-C  oxyCODONE-acetaminophen (PERCOCET) 5-325 MG per tablet Take 1 tablet by mouth every 4 (four) hours as needed for severe pain. 07/06/14   Mancel Bale, MD    Family History No family history on file.  Social History Social History  Substance Use Topics  . Smoking status: Never Smoker  . Smokeless tobacco: Never Used  . Alcohol use No     Allergies   Patient has no known allergies.   Review of Systems Review of  Systems  Musculoskeletal: Positive for arthralgias.  Skin: Positive for wound.  Neurological: Positive for numbness. Negative for weakness.     Physical Exam Updated Vital Signs BP 134/88 (BP Location: Right Arm)   Pulse 95   Temp 98.5 F (36.9 C) (Oral)   Resp 16   Ht  (1.6 m)   Wt 68 kg (150 lb)   SpO2 98%   BMI 26.57 kg/m   Physical Exam  Constitutional: He is oriented to person, place, and time. He appears well-developed and well-nourished. No distress.  HENT:  Head: Normocephalic and atraumatic.  Eyes: Pupils are equal, round, and reactive to light. Conjunctivae are normal. Right eye exhibits no discharge. Left eye exhibits no discharge. No scleral icterus.  Neck: Normal range of motion.  Cardiovascular: Normal rate.   Pulmonary/Chest: Effort normal. No respiratory distress.  Abdominal: He exhibits no distension.  Musculoskeletal:  Right thumb: No obvious swelling, deformity, or warmth. Laceration which is healing by secondary intention. No signs of infection. FROM of thumb. Subjective decreased sensation over tip of thumb. 2+ radial pulse   Neurological: He is alert and oriented to person, place, and time.  Skin: Skin is warm and dry.  Psychiatric: He has a normal mood and affect. His behavior is normal.  Nursing note and vitals reviewed.    ED Treatments / Results  Labs (all labs ordered are listed, but only  abnormal results are displayed) Labs Reviewed - No data to display  EKG  EKG Interpretation None       Radiology No results found.  Procedures Procedures (including critical care time)  Medications Ordered in ED Medications - No data to display   Initial Impression / Assessment and Plan / ED Course  I have reviewed the triage vital signs and the nursing notes.  Pertinent labs & imaging results that were available during my care of the patient were reviewed by me and considered in my medical decision making (see chart for details).  26  year old male presents with ongoing thumb pain and paresthesia. No signs of infection or swelling. He is requesting a work note because he thinks heavy lifting is aggravating his symptoms. This was provided. Advised to ice if there is swelling and do ROM exercises to prevent stiffness. Also advised numbness is normal and feeling will likely return with time. Advised f/u with hand surgery if symptoms are persistent.  Final Clinical Impressions(s) / ED Diagnoses   Final diagnoses:  Thumb numbness    New Prescriptions New Prescriptions   No medications on file     Beryle Quant 01/11/17 2203    Pricilla Loveless, MD 01/13/17 [phone]

## 2017-01-11 NOTE — Discharge Instructions (Signed)
Please follow up with hand surgery if you are still having difficulty moving your thumb Please ice the thumb if swollen.  Practice range of motion exercises of the thumb to prevent stiffness Keep wound clean and dry

## 2017-01-11 NOTE — ED Triage Notes (Signed)
Pt cut left thumb with box cutter on 01/08/17, seen at [facility], states the tip of his thumb is numb.

## 2018-02-09 ENCOUNTER — Emergency Department (HOSPITAL_BASED_OUTPATIENT_CLINIC_OR_DEPARTMENT_OTHER)
Admission: EM | Admit: 2018-02-09 | Discharge: 2018-02-09 | Disposition: A | Discharge status: Home / Self Care | Payer: Self-pay | Attending: Emergency Medicine | Admitting: Emergency Medicine

## 2018-02-09 ENCOUNTER — Encounter (HOSPITAL_BASED_OUTPATIENT_CLINIC_OR_DEPARTMENT_OTHER): Payer: Self-pay

## 2018-02-09 ENCOUNTER — Other Ambulatory Visit: Payer: Self-pay

## 2018-02-09 DIAGNOSIS — M25572 Pain in left ankle and joints of left foot: Secondary | ICD-10-CM | POA: Insufficient documentation

## 2018-02-09 DIAGNOSIS — M7660 Achilles tendinitis, unspecified leg: Secondary | ICD-10-CM

## 2018-02-09 MED ORDER — NAPROXEN 500 MG PO TABS: 500.0000 mg | ORAL_TABLET | Freq: Two times a day (BID) | ORAL | 0 refills | Status: AC

## 2018-02-09 NOTE — ED Provider Notes (Signed)
[facility] EMERGENCY DEPARTMENT Provider Note   CSN: [id number] Arrival date & time: 02/09/18  1841     History   Chief Complaint Chief Complaint  Patient presents with  . Tendonitis    HPI Edwin Miranda is a 27 y.o. male for evaluation of left ankle pain.  Patient states for the past 3 weeks he has been having gradually increasing L ankle pain.  Pain is over his Achilles tendon, worse with movement of his foot and weightbearing.  He has not taken anything for pain including Tylenol or ibuprofen.  He is here today because the pain has been increasing and not improving.  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  He has been there for about a year.  He denies fall, trauma, or injury.  He denies pain elsewhere.  He denies numbness or tingling.  No redness or welling of his leg.  Recent travel, surgeries, immobilization, history of cancer, history of previous DVT/PE, or hormone use.  He has no other medical problems, takes medications daily.  HPI  History reviewed. No pertinent past medical history.  There are no active problems to display for this patient.   History reviewed. No pertinent surgical history.      Home Medications    Prior to Admission medications   Medication Sig Start Date End Date Taking? Authorizing Provider  clindamycin (CLEOCIN) 300 MG capsule Take 1 capsule (300 mg total) by mouth 4 (four) times daily. X 7 days 07/06/14   Mancel Bale, MD  ibuprofen (ADVIL,MOTRIN) 200 MG tablet Take 400 mg by mouth every 6 (six) hours as needed for moderate pain.    [provider]  naproxen (NAPROSYN) 500 MG tablet Take 1 tablet (500 mg total) by mouth 2 (two) times daily with a meal. 02/09/18   Parth Mccormac, PA-C  ondansetron (ZOFRAN ODT) 4 MG disintegrating tablet Take 1 tablet (4 mg total) by mouth every 8 (eight) hours as needed for nausea or vomiting. Patient not taking: Reported on 07/06/2014 01/15/14    Piepenbrink, Victorino Dike, PA-C  oxyCODONE-acetaminophen (PERCOCET) 5-325 MG per tablet Take 1 tablet by mouth every 4 (four) hours as needed for severe pain. 07/06/14   Mancel Bale, MD    Family History No family history on file.  Social History Social History   Tobacco Use  . Smoking status: Never Smoker  . Smokeless tobacco: Never Used  Substance Use Topics  . Alcohol use: No  . Drug use: Yes    Types: Marijuana     Allergies   Patient has no known allergies.   Review of Systems Review of Systems  Musculoskeletal: Positive for myalgias.  Neurological: Negative for numbness.     Physical Exam Updated Vital Signs BP 117/88 (BP Location: Left Arm)   Pulse 70   Temp 98.3 F (36.8 C) (Oral)   Resp 18   Ht 5\' 2"  (1.575 m)   Wt 63.5 kg   SpO2 100%   BMI 25.61 kg/m   Physical Exam  Constitutional: He is oriented to person, place, and time. He appears well-developed and well-nourished. No distress.  HENT:  Head: Normocephalic and atraumatic.  Eyes: EOM are normal.  Neck: Normal range of motion.  Pulmonary/Chest: Effort normal.  Abdominal: He exhibits no distension.  Musculoskeletal: Normal range of motion. He exhibits tenderness. He exhibits no edema or deformity.  Tenderness palpation overlying the Achilles tendon and plantar foot over the calcaneus.  No obvious  deformity.  No swelling, erythema, or warmth.  No tenderness palpation of the calf.  Pedal pulses intact bilaterally.  Full active range of motion of the foot with pain, decreased pain with active range of motion.  Dorsiflexion causes increased amount of pain.  Neurological: He is alert and oriented to person, place, and time. No sensory deficit.  Skin: Skin is warm. Capillary refill takes less than 2 seconds. No rash noted.  Psychiatric: He has a normal mood and affect.  Nursing note and vitals reviewed.    ED Treatments / Results  Labs (all labs ordered are listed, but only abnormal results are  displayed) Labs Reviewed - No data to display  EKG None  Radiology No results found.  Procedures Procedures (including critical care time)  Medications Ordered in ED Medications - No data to display   Initial Impression / Assessment and Plan / ED Course  I have reviewed the triage vital signs and the nursing notes.  Pertinent labs & imaging results that were available during my care of the patient were reviewed by me and considered in my medical decision making (see chart for details).     Pt presented for evaluation of left ankle pain.  Physical exam reassuring, he is neurovascularly intact.  Pain is reproducible with palpation of the Achilles tendon, worse with dorsiflexion.  No erythema, warmth, or swelling.  No risk factors for DVT.  Doubt DVT at this time.  Likely tendinitis, as pain is overlying the Achilles tendon.  Patient is a job in which does a lot of lifting.  Discussed findings with patient.  Discussed with ASO, NSAIDs, rest, and ice.  Follow-up with Ortho if symptoms are not improving.  At this time, patient appears safe for discharge.  Return precautions given.  Patient states he understands and agrees to plan.   Final Clinical Impressions(s) / ED Diagnoses   Final diagnoses:  Pain in Achilles tendon    ED Discharge Orders         Ordered    naproxen (NAPROSYN) 500 MG tablet  2 times daily with meals     02/09/18 1920           Alveria Apley, PA-C 02/09/18 2318    Benjiman Core, MD 02/09/18 [phone]

## 2018-02-09 NOTE — ED Triage Notes (Signed)
Pt c/o left "my achilles"-pain started last night-denies injury-NAD-steady gait

## 2018-02-09 NOTE — Discharge Instructions (Signed)
Take naproxen 2 times a day with meals.  Do not take other anti-inflammatories at the same time (Advil, Motrin, ibuprofen, Aleve). You may supplement with Tylenol if you need further pain control. Use ice packs, 20 minutes at a time, up to 4 times a day. Wear the ASO for support and pain control.  Follow-up with orthopedics if your symptoms are not improving with this treatment in 1 week.   Return to the emergency room if you develop numbness, inability to move your ankle, fever/chills, redness of your leg, or any new or concerning symptoms.

## 2018-02-20 ENCOUNTER — Ambulatory Visit: Payer: Self-pay | Admitting: Family Medicine

## 2019-05-01 ENCOUNTER — Ambulatory Visit: Payer: Self-pay | Attending: Internal Medicine

## 2019-05-01 DIAGNOSIS — Z20822 Contact with and (suspected) exposure to covid-19: Secondary | ICD-10-CM

## 2019-05-03 LAB — NOVEL CORONAVIRUS, NAA: SARS-CoV-2, NAA: DETECTED — AB

## 2019-12-13 ENCOUNTER — Other Ambulatory Visit: Payer: Self-pay

## 2020-08-29 ENCOUNTER — Encounter (HOSPITAL_COMMUNITY): Payer: Self-pay | Admitting: *Deleted

## 2020-08-29 ENCOUNTER — Other Ambulatory Visit: Payer: Self-pay

## 2020-08-29 ENCOUNTER — Emergency Department (HOSPITAL_COMMUNITY)
Admission: EM | Admit: 2020-08-29 | Discharge: 2020-08-29 | Disposition: A | Discharge status: Home / Self Care | Payer: Self-pay | Attending: Emergency Medicine | Admitting: Emergency Medicine

## 2020-08-29 ENCOUNTER — Emergency Department (HOSPITAL_COMMUNITY): Payer: Self-pay

## 2020-08-29 DIAGNOSIS — R1110 Vomiting, unspecified: Secondary | ICD-10-CM | POA: Insufficient documentation

## 2020-08-29 DIAGNOSIS — F17200 Nicotine dependence, unspecified, uncomplicated: Secondary | ICD-10-CM | POA: Insufficient documentation

## 2020-08-29 DIAGNOSIS — R1111 Vomiting without nausea: Secondary | ICD-10-CM

## 2020-08-29 DIAGNOSIS — R1084 Generalized abdominal pain: Secondary | ICD-10-CM | POA: Insufficient documentation

## 2020-08-29 LAB — COMPREHENSIVE METABOLIC PANEL
ALT: 29 U/L (ref 0–44)
AST: 23 U/L (ref 15–41)
Albumin: 4.2 g/dL (ref 3.5–5.0)
Alkaline Phosphatase: 70 U/L (ref 38–126)
Anion gap: 10 (ref 5–15)
BUN: 12 mg/dL (ref 6–20)
CO2: 22 mmol/L (ref 22–32)
Calcium: 9.5 mg/dL (ref 8.9–10.3)
Chloride: 104 mmol/L (ref 98–111)
Creatinine, Ser: 1.03 mg/dL (ref 0.61–1.24)
GFR, Estimated: >60 mL/min (ref >60)
Glucose, Bld: 99 mg/dL (ref 70–99)
Potassium: 4.1 mmol/L (ref 3.5–5.1)
Sodium: 136 mmol/L (ref 135–145)
Total Bilirubin: 1.9 mg/dL — ABNORMAL HIGH (ref 0.3–1.2)
Total Protein: 7.5 g/dL (ref 6.5–8.1)

## 2020-08-29 LAB — CBC
HCT: 49.5 % (ref 39.0–52.0)
Hemoglobin: 16.9 g/dL (ref 13.0–17.0)
MCH: 29.3 pg (ref 26.0–34.0)
MCHC: 34.1 g/dL (ref 30.0–36.0)
MCV: 85.8 fL (ref 80.0–100.0)
Platelets: 230 10*3/uL (ref 150–400)
RBC: 5.77 MIL/uL (ref 4.22–5.81)
RDW: 13 % (ref 11.5–15.5)
WBC: 9.4 10*3/uL (ref 4.0–10.5)
nRBC: 0 % (ref 0.0–0.2)

## 2020-08-29 LAB — URINALYSIS, ROUTINE W REFLEX MICROSCOPIC
Bilirubin Urine: NEGATIVE
Glucose, UA: NEGATIVE mg/dL
Hgb urine dipstick: NEGATIVE
Ketones, ur: 5 mg/dL — AB
Leukocytes,Ua: NEGATIVE
Nitrite: NEGATIVE
Protein, ur: NEGATIVE mg/dL
Specific Gravity, Urine: 1.019 (ref 1.005–1.030)
pH: 5 (ref 5.0–8.0)

## 2020-08-29 LAB — LIPASE, BLOOD: Lipase: 27 U/L (ref 11–51)

## 2020-08-29 MED ORDER — SODIUM CHLORIDE 0.9 % IV BOLUS
1000.0000 mL | Freq: Once | INTRAVENOUS | Status: AC
  Administered 2020-08-29: 1000 mL via INTRAVENOUS

## 2020-08-29 MED ORDER — ONDANSETRON 4 MG PO TBDP
4.0000 mg | ORAL_TABLET | Freq: Once | ORAL | Status: AC | PRN
  Administered 2020-08-29: 4 mg via ORAL
  Filled 2020-08-29: qty 1

## 2020-08-29 MED ORDER — HYDROMORPHONE HCL 1 MG/ML IJ SOLN
1.0000 mg | Freq: Once | INTRAMUSCULAR | Status: AC
  Administered 2020-08-29: 1 mg via INTRAVENOUS
  Filled 2020-08-29: qty 1

## 2020-08-29 MED ORDER — ONDANSETRON 4 MG PO TBDP: 4.0000 mg | ORAL_TABLET | Freq: Three times a day (TID) | ORAL | 0 refills | Status: DC | PRN

## 2020-08-29 MED ORDER — ONDANSETRON HCL 4 MG/2ML IJ SOLN
4.0000 mg | Freq: Once | INTRAMUSCULAR | Status: AC
  Administered 2020-08-29: 4 mg via INTRAVENOUS
  Filled 2020-08-29: qty 2

## 2020-08-29 NOTE — Discharge Instructions (Signed)
Return if any problems.

## 2020-08-29 NOTE — ED Triage Notes (Signed)
Patient presents to ed c/o abd. Pain and vomiting onset yest.

## 2020-08-29 NOTE — ED Provider Notes (Signed)
[facility] EMERGENCY DEPARTMENT Provider Note   CSN: [id number] Arrival date & time: 08/29/20  3557     History Chief Complaint  Patient presents with  . Abdominal Pain  . Emesis    Edwin Miranda is a 30 y.o. male.  The history is provided by the patient. No language interpreter was used.  Abdominal Pain Pain quality: aching   Pain radiates to:  Does not radiate Pain severity:  Moderate Onset quality:  Gradual Duration:  1 day Timing:  Constant Progression:  Worsening Chronicity:  New Relieved by:  Nothing Worsened by:  Nothing Associated symptoms: vomiting   Emesis Associated symptoms: abdominal pain        History reviewed. No pertinent past medical history.  There are no problems to display for this patient.   History reviewed. No pertinent surgical history.     No family history on file.  Social History   Tobacco Use  . Smoking status: Current Some Day Smoker  . Smokeless tobacco: Never Used  Substance Use Topics  . Alcohol use: No  . Drug use: Yes    Types: Marijuana    Home Medications Prior to Admission medications   Medication Sig Start Date End Date Taking? Authorizing Provider  ondansetron (ZOFRAN ODT) 4 MG disintegrating tablet Take 1 tablet (4 mg total) by mouth every 8 (eight) hours as needed for nausea or vomiting. 08/29/20  Yes Cheron Schaumann K, PA-C  clindamycin (CLEOCIN) 300 MG capsule Take 1 capsule (300 mg total) by mouth 4 (four) times daily. X 7 days 07/06/14   Mancel Bale, MD  ibuprofen (ADVIL,MOTRIN) 200 MG tablet Take 400 mg by mouth every 6 (six) hours as needed for moderate pain.    [provider]  naproxen (NAPROSYN) 500 MG tablet Take 1 tablet (500 mg total) by mouth 2 (two) times daily with a meal. 02/09/18   Caccavale, Sophia, PA-C  oxyCODONE-acetaminophen (PERCOCET) 5-325 MG per tablet Take 1 tablet by mouth every 4 (four) hours as needed for severe pain. 07/06/14   Mancel Bale, MD     Allergies    Patient has no known allergies.  Review of Systems   Review of Systems  Gastrointestinal: Positive for abdominal pain and vomiting.  All other systems reviewed and are negative.   Physical Exam Updated Vital Signs BP 134/80   Pulse 61   Temp 98.7 F (37.1 C) (Oral)   Resp 14   Ht 5\' 2"  (1.575 m)   Wt 68 kg   SpO2 100%   BMI 27.44 kg/m   Physical Exam Vitals and nursing note reviewed.  Constitutional:      Appearance: He is well-developed.  HENT:     Head: Normocephalic and atraumatic.  Eyes:     Conjunctiva/sclera: Conjunctivae normal.  Cardiovascular:     Rate and Rhythm: Normal rate and regular rhythm.     Heart sounds: No murmur heard.   Pulmonary:     Effort: Pulmonary effort is normal. No respiratory distress.     Breath sounds: Normal breath sounds.  Abdominal:     General: Abdomen is flat. Bowel sounds are normal.     Palpations: Abdomen is soft.     Tenderness: There is no abdominal tenderness.  Musculoskeletal:     Cervical back: Neck supple.  Skin:    General: Skin is warm and dry.  Neurological:     General: No focal deficit present.     Mental Status: He is alert.  Psychiatric:  Mood and Affect: Mood normal.     ED Results / Procedures / Treatments   Labs (all labs ordered are listed, but only abnormal results are displayed) Labs Reviewed  COMPREHENSIVE METABOLIC PANEL - Abnormal; Notable for the following components:      Result Value   Total Bilirubin 1.9 (*)    All other components within normal limits  URINALYSIS, ROUTINE W REFLEX MICROSCOPIC - Abnormal; Notable for the following components:   Ketones, ur 5 (*)    All other components within normal limits  LIPASE, BLOOD  CBC    EKG None  Radiology CT ABDOMEN PELVIS WO CONTRAST  Result Date: 08/29/2020 CLINICAL DATA:  Acute abdominal pain, lower abdominal pain with nausea and vomiting. EXAM: CT ABDOMEN AND PELVIS WITHOUT CONTRAST TECHNIQUE:  Multidetector CT imaging of the abdomen and pelvis was performed following the standard protocol without IV contrast. COMPARISON:  CT abdomen dated 01/15/2014. FINDINGS: Lower chest: No acute abnormality. Hepatobiliary: No focal liver abnormality is seen. Gallbladder appears normal. No bile duct dilatation. Pancreas: Unremarkable. No pancreatic ductal dilatation or surrounding inflammatory changes. Spleen: Normal in size without focal abnormality. Adrenals/Urinary Tract: Adrenal glands appear normal. Kidneys are unremarkable without stone or hydronephrosis. No perinephric fluid. No ureteral or bladder calculi are identified. Bladder is unremarkable, partially decompressed. Stomach/Bowel: No dilated large or small bowel loops. No evidence of bowel wall inflammation. Stomach is unremarkable, partially decompressed. Appendix appears normal. Moderate amount of stool and gas within the nondistended colon. Vascular/Lymphatic: No significant vascular findings are present. No enlarged abdominal or pelvic lymph nodes. Reproductive: Prostate is unremarkable. Other: No free fluid or abscess collection. No free intraperitoneal air. Musculoskeletal: Osseous structures are unremarkable. Superficial soft tissues of the abdomen and pelvis are unremarkable. IMPRESSION: No acute findings within the abdomen or pelvis. No bowel obstruction or evidence of bowel wall inflammation. No evidence of acute solid organ abnormality. No renal or ureteral calculi. Appendix appears normal. Moderate amount of stool and gas within the colon (constipation?). Electronically Signed   By: Bary Richard M.D.   On: 08/29/2020 12:31    Procedures Procedures   Medications Ordered in ED Medications  ondansetron (ZOFRAN-ODT) disintegrating tablet 4 mg (4 mg Oral Given 08/29/20 0637)  sodium chloride 0.9 % bolus 1,000 mL (0 mLs Intravenous Stopped 08/29/20 1250)  ondansetron (ZOFRAN) injection 4 mg (4 mg Intravenous Given 08/29/20 1147)  HYDROmorphone  (DILAUDID) injection 1 mg (1 mg Intravenous Given 08/29/20 1148)    ED Course  I have reviewed the triage vital signs and the nursing notes.  Pertinent labs & imaging results that were available during my care of the patient were reviewed by me and considered in my medical decision making (see chart for details).    MDM Rules/Calculators/A&P                          MDM:  Pt given iv fluids and zofran.  Ct no acute abnormality  Pt counseled on probable viral illness.  Final Clinical Impression(s) / ED Diagnoses Final diagnoses:  Generalized abdominal pain  Vomiting without nausea, intractability of vomiting not specified, unspecified vomiting type    Rx / DC Orders ED Discharge Orders         Ordered    ondansetron (ZOFRAN ODT) 4 MG disintegrating tablet  Every 8 hours PRN        08/29/20 1333        An After Visit Summary was printed and given  to the patient.    Elson Areas, [place] 08/29/20 1339    Margarita Grizzle, MD 08/31/20 [phone]

## 2020-08-29 NOTE — ED Provider Notes (Addendum)
Emergency Medicine Provider Triage Evaluation Note  Edwin Miranda , a 30 y.o. male  was evaluated in triage.  Pt complains of patient states that he is having generalized achy abdominal pain is been ongoing since 9 or 10 PM yesterday.  He states there are no significant aggravating or mitigating factors.  States that symptoms been constant.  Denies any changes with his bowel movements although he has not had a bowel movement today.  He states that he has had several episodes of nonbloody nonbilious emesis.  Pain seems to be focally more in the epigastric area.  No fevers or chills.  No blood in stool.  No recent antibiotics.  Review of Systems  Positive: Abdominal pain, nausea, vomiting Negative: Diarrhea  Physical Exam  BP (!) 133/96 (BP Location: Left Arm)   Pulse 88   Temp 98.7 F (37.1 C) (Oral)   Resp 16   Ht 5\' 2"  (1.575 m)   Wt 68 kg   SpO2 97%   BMI 27.44 kg/m  Gen:   Awake, no distress, nontoxic appearing, able answer questions appropriate follow commands Resp:  Normal effort no tachypnea or increased work of breathing MSK:   Moves extremities without difficulty with good strength in all 4 extremities Other:  No significant abdominal tenderness.  No right upper quadrant tenderness to palpation negative Murphy sign.  Points that abdomen diffusely when discussing pain although epigastrium seems to be area where he is pointing more aggressively  Medical Decision Making  Medically screening exam initiated at 8:14 AM.  Appropriate orders placed.  Steen Bisig was informed that the remainder of the evaluation will be completed by another provider, this initial triage assessment does not replace that evaluation, and the importance of remaining in the ED until their evaluation is complete.  Patient is a 30 year old male no pertinent past medical history presented today for nausea vomiting abdominal discomfort.  Patient does not have any significant abdominal tenderness.  Is overall  well-appearing.  Suspect viral illness.  His nausea is improved with Zofran has not vomited since being provided this.  Suspect viral process.  Patient may benefit from p.o. versus IV fluids.  If continues to tolerate p.o.  Notably patient's lab work has returned and is unremarkable.  CMP without any significant electrolyte abnormalities.  Very marginally elevated total bilirubin in the absence of right upper quadrant abdominal tenderness or epigastric tenderness.  Lipase within normal limits and CBC without leukocytosis or anemia.  Urinalysis without evidence of infection.   38, Gailen Shelter 08/29/20 0816    08/31/20, PA 08/29/20 08/31/20    0981, MD 09/10/20 [phone]

## 2020-08-29 NOTE — ED Notes (Signed)
To CT scan

## 2021-04-20 ENCOUNTER — Other Ambulatory Visit: Payer: Self-pay

## 2021-04-20 ENCOUNTER — Ambulatory Visit
Admission: EM | Admit: 2021-04-20 | Discharge: 2021-04-20 | Disposition: A | Discharge status: Home / Self Care | Payer: Medicaid Other | Attending: Physician Assistant | Admitting: Physician Assistant

## 2021-04-20 DIAGNOSIS — R110 Nausea: Secondary | ICD-10-CM

## 2021-04-20 DIAGNOSIS — Z20828 Contact with and (suspected) exposure to other viral communicable diseases: Secondary | ICD-10-CM

## 2021-04-20 MED ORDER — ONDANSETRON 4 MG PO TBDP: 4.0000 mg | ORAL_TABLET | Freq: Three times a day (TID) | ORAL | 0 refills | Status: DC | PRN

## 2021-04-20 NOTE — ED Provider Notes (Signed)
[facility]    CSN: [id number] Arrival date & time: 04/20/21  0906      History   Chief Complaint Chief Complaint  Patient presents with   decreased appetite    HPI Edwin Miranda is a 31 y.o. male.   Patient here today for evaluation of decreased appetite, nausea, cough, congestion, and chills that started 4 days ago. He states his son has tested positive for the flu. He denies sore throat. He has taken mucinex without significant relief.    He also reports some pain and "locking" of his left thumb at times. This has been occurring intermittently for several months.  The history is provided by the patient.   History reviewed. No pertinent past medical history.  There are no problems to display for this patient.   History reviewed. No pertinent surgical history.     Home Medications    Prior to Admission medications   Medication Sig Start Date End Date Taking? Authorizing Provider  ondansetron (ZOFRAN-ODT) 4 MG disintegrating tablet Take 1 tablet (4 mg total) by mouth every 8 (eight) hours as needed. 04/20/21  Yes Tomi Bamberger, PA-C  clindamycin (CLEOCIN) 300 MG capsule Take 1 capsule (300 mg total) by mouth 4 (four) times daily. X 7 days 07/06/14   Mancel Bale, MD  ibuprofen (ADVIL,MOTRIN) 200 MG tablet Take 400 mg by mouth every 6 (six) hours as needed for moderate pain.    [provider]  naproxen (NAPROSYN) 500 MG tablet Take 1 tablet (500 mg total) by mouth 2 (two) times daily with a meal. 02/09/18   Caccavale, Sophia, PA-C  oxyCODONE-acetaminophen (PERCOCET) 5-325 MG per tablet Take 1 tablet by mouth every 4 (four) hours as needed for severe pain. 07/06/14   Mancel Bale, MD    Family History History reviewed. No pertinent family history.  Social History Social History   Tobacco Use   Smoking status: Some Days   Smokeless tobacco: Never  Substance Use Topics   Alcohol use: No   Drug use: Yes    Types: Marijuana      Allergies   Patient has no known allergies.   Review of Systems Review of Systems  Constitutional:  Positive for chills and fever (subjective).  HENT:  Positive for congestion. Negative for ear pain and sore throat.   Eyes:  Negative for discharge and redness.  Respiratory:  Positive for cough. Negative for shortness of breath.   Gastrointestinal:  Positive for nausea. Negative for abdominal pain and vomiting.    Physical Exam Triage Vital Signs ED Triage Vitals  Enc Vitals Group     BP 04/20/21 0917 123/85     Pulse Rate 04/20/21 0917 87     Resp 04/20/21 0917 18     Temp 04/20/21 0917 98.7 F (37.1 C)     Temp Source 04/20/21 0917 Oral     SpO2 04/20/21 0917 98 %     Weight --      Height --      Head Circumference --      Peak Flow --      Pain Score 04/20/21 0919 7     Pain Loc --      Pain Edu? --      Excl. in GC? --    No data found.  Updated Vital Signs BP 123/85 (BP Location: Left Arm)    Pulse 87    Temp 98.7 F (37.1 C) (Oral)    Resp 18  SpO2 98%   Physical Exam Vitals and nursing note reviewed.  Constitutional:      General: He is not in acute distress.    Appearance: Normal appearance. He is not ill-appearing.  HENT:     Head: Normocephalic and atraumatic.     Nose: Congestion present.  Eyes:     Conjunctiva/sclera: Conjunctivae normal.  Cardiovascular:     Rate and Rhythm: Normal rate and regular rhythm.     Heart sounds: Normal heart sounds. No murmur heard. Pulmonary:     Effort: Pulmonary effort is normal. No respiratory distress.     Breath sounds: Normal breath sounds. No wheezing, rhonchi or rales.  Skin:    General: Skin is warm and dry.  Neurological:     Mental Status: He is alert.  Psychiatric:        Mood and Affect: Mood normal.        Thought Content: Thought content normal.     UC Treatments / Results  Labs (all labs ordered are listed, but only abnormal results are displayed) Labs Reviewed  COVID-19, FLU A+B  NAA    EKG   Radiology No results found.  Procedures Procedures (including critical care time)  Medications Ordered in UC Medications - No data to display  Initial Impression / Assessment and Plan / UC Course  I have reviewed the triage vital signs and the nursing notes.  Pertinent labs & imaging results that were available during my care of the patient were reviewed by me and considered in my medical decision making (see chart for details).    Suspect likely influenza given known exposure. Patient out of treatment window for tamiflu but would like to be screened for flu and covid. Zofran prescribed for nausea.  Recommended when well that he seek further evaluation by ortho regarding thumb complaints. Contact information provided for same.   Final Clinical Impressions(s) / UC Diagnoses   Final diagnoses:  Exposure to the flu  Nausea   Discharge Instructions   None    ED Prescriptions     Medication Sig Dispense Auth. Provider   ondansetron (ZOFRAN-ODT) 4 MG disintegrating tablet Take 1 tablet (4 mg total) by mouth every 8 (eight) hours as needed. 20 tablet Tomi Bamberger, PA-C      PDMP not reviewed this encounter.   Tomi Bamberger, PA-C 04/20/21 1017

## 2021-04-20 NOTE — ED Triage Notes (Addendum)
4 day h/o intermittent chills and decreased appetite. Three days of cough and congestion. Pt notes drinking and eating causes him to feel dizzy and have HA. Confirms abdominal pain. No v/d. Has taken one dose of mucinex with some relief.  Son with flu.

## 2021-04-21 LAB — COVID-19, FLU A+B NAA
Influenza A, NAA: DETECTED — AB
Influenza B, NAA: NOT DETECTED
SARS-CoV-2, NAA: NOT DETECTED

## 2021-10-05 ENCOUNTER — Emergency Department (HOSPITAL_COMMUNITY)
Admission: EM | Admit: 2021-10-05 | Discharge: 2021-10-05 | Disposition: A | Discharge status: Home / Self Care | Payer: Self-pay | Attending: Emergency Medicine | Admitting: Emergency Medicine

## 2021-10-05 ENCOUNTER — Encounter (HOSPITAL_COMMUNITY): Payer: Self-pay

## 2021-10-05 ENCOUNTER — Emergency Department (HOSPITAL_COMMUNITY): Payer: Self-pay

## 2021-10-05 ENCOUNTER — Other Ambulatory Visit: Payer: Self-pay

## 2021-10-05 DIAGNOSIS — R509 Fever, unspecified: Secondary | ICD-10-CM | POA: Insufficient documentation

## 2021-10-05 DIAGNOSIS — R112 Nausea with vomiting, unspecified: Secondary | ICD-10-CM | POA: Insufficient documentation

## 2021-10-05 DIAGNOSIS — R5381 Other malaise: Secondary | ICD-10-CM | POA: Insufficient documentation

## 2021-10-05 DIAGNOSIS — E876 Hypokalemia: Secondary | ICD-10-CM | POA: Insufficient documentation

## 2021-10-05 DIAGNOSIS — R519 Headache, unspecified: Secondary | ICD-10-CM | POA: Insufficient documentation

## 2021-10-05 LAB — URINALYSIS, ROUTINE W REFLEX MICROSCOPIC
Bilirubin Urine: NEGATIVE
Glucose, UA: NEGATIVE mg/dL
Hgb urine dipstick: NEGATIVE
Ketones, ur: 20 mg/dL — AB
Leukocytes,Ua: NEGATIVE
Nitrite: NEGATIVE
Protein, ur: NEGATIVE mg/dL
Specific Gravity, Urine: 1.017 (ref 1.005–1.030)
pH: 6 (ref 5.0–8.0)

## 2021-10-05 LAB — BASIC METABOLIC PANEL
Anion gap: 14 (ref 5–15)
BUN: 10 mg/dL (ref 6–20)
CO2: 21 mmol/L — ABNORMAL LOW (ref 22–32)
Calcium: 9.6 mg/dL (ref 8.9–10.3)
Chloride: 103 mmol/L (ref 98–111)
Creatinine, Ser: 1.09 mg/dL (ref 0.61–1.24)
GFR, Estimated: >60 mL/min (ref >60)
Glucose, Bld: 111 mg/dL — ABNORMAL HIGH (ref 70–99)
Potassium: 3.3 mmol/L — ABNORMAL LOW (ref 3.5–5.1)
Sodium: 138 mmol/L (ref 135–145)

## 2021-10-05 LAB — CBC
HCT: 46.7 % (ref 39.0–52.0)
Hemoglobin: 16.2 g/dL (ref 13.0–17.0)
MCH: 28.9 pg (ref 26.0–34.0)
MCHC: 34.7 g/dL (ref 30.0–36.0)
MCV: 83.2 fL (ref 80.0–100.0)
Platelets: 193 10*3/uL (ref 150–400)
RBC: 5.61 MIL/uL (ref 4.22–5.81)
RDW: 12.2 % (ref 11.5–15.5)
WBC: 6.1 10*3/uL (ref 4.0–10.5)
nRBC: 0 % (ref 0.0–0.2)

## 2021-10-05 MED ORDER — ACETAMINOPHEN 500 MG PO TABS
1000.0000 mg | ORAL_TABLET | Freq: Once | ORAL | Status: AC
  Administered 2021-10-05: 1000 mg via ORAL
  Filled 2021-10-05: qty 2

## 2021-10-05 MED ORDER — SODIUM CHLORIDE 0.9 % IV BOLUS
1000.0000 mL | Freq: Once | INTRAVENOUS | Status: AC
  Administered 2021-10-05: 1000 mL via INTRAVENOUS

## 2021-10-05 MED ORDER — DIPHENHYDRAMINE HCL 50 MG/ML IJ SOLN
12.5000 mg | Freq: Once | INTRAMUSCULAR | Status: AC
  Administered 2021-10-05: 12.5 mg via INTRAVENOUS
  Filled 2021-10-05: qty 1

## 2021-10-05 MED ORDER — AMLODIPINE BESYLATE 5 MG PO TABS
5.0000 mg | ORAL_TABLET | Freq: Once | ORAL | Status: AC
  Administered 2021-10-05: 5 mg via ORAL
  Filled 2021-10-05: qty 1

## 2021-10-05 MED ORDER — POTASSIUM CHLORIDE CRYS ER 20 MEQ PO TBCR
40.0000 meq | EXTENDED_RELEASE_TABLET | Freq: Once | ORAL | Status: AC
  Administered 2021-10-05: 40 meq via ORAL
  Filled 2021-10-05: qty 2

## 2021-10-05 MED ORDER — ONDANSETRON HCL 4 MG PO TABS: 4.0000 mg | ORAL_TABLET | Freq: Three times a day (TID) | ORAL | 0 refills | Status: AC | PRN

## 2021-10-05 MED ORDER — PROCHLORPERAZINE EDISYLATE 10 MG/2ML IJ SOLN
10.0000 mg | INTRAMUSCULAR | Status: AC
Start: 2021-10-05 — End: 2021-10-05
  Administered 2021-10-05: 10 mg via INTRAVENOUS
  Filled 2021-10-05: qty 2

## 2021-10-05 MED ORDER — ONDANSETRON 4 MG PO TBDP
4.0000 mg | ORAL_TABLET | Freq: Once | ORAL | Status: AC
  Administered 2021-10-05: 4 mg via ORAL
  Filled 2021-10-05: qty 1

## 2021-10-05 NOTE — Discharge Instructions (Addendum)
Return to ED with any new symptoms Please follow-up with PCP for further management.  If you do not have a PCP, I have referred you to one.  You will need to call to make an appointment to be seen. Please continue hydrating self at home.  You are noted to have low potassium here today.  You can replete this utilizing Gatorade, coconut water, bananas Please pick up prescription Zofran I have sent in for you.

## 2021-10-05 NOTE — ED Provider Triage Note (Signed)
Emergency Medicine Provider Triage Evaluation Note  Edwin Miranda , a 31 y.o. male  was evaluated in triage.  Pt complains of feeling off since around Friday with nausea, malaise, headache.  He reports the headache has been off and on since then, worse this morning.  He endorses nausea, photosensitivity, lack of appetite.  He endorses some sweating at night.  He denies any other previous medical history.  He reports that he thought he was dehydrated and has been drinking lots of fluids and Gatorade.  He has not taken anything for pain.  Patient denies any confusion, sudden unilateral weakness, numbness.  He does feel like his whole head/neck feels tingling/numb.  He reports that he feels globally weak.  He is ambulatory in triage, he has some distress secondary to photosensitivity, headache.  Patient reports that the photosensitivity makes it difficult to see, but when he can open his eyes in a dim room he has no difficulty with identifying multiple objects.  Denies any feeling of curtain coming over vision, diplopia.  Review of Systems  Positive: Headache, nausea Negative: Unilateral numbness, facial droop, confusion  Physical Exam  BP (!) 153/103 (BP Location: Left Arm)   Pulse 84   Temp 98.3 F (36.8 C) (Oral)   Resp 17   Ht 5\' 2"  (1.575 m)   Wt 68 kg   SpO2 100%   BMI 27.44 kg/m  Gen:   Awake, no distress   Resp:  Normal effort  MSK:   Moves extremities without difficulty  Other:  Cn2-12 grossly intact, patient endorses some sensory discrepancy globally over face, no unilateral deficits noted  Medical Decision Making  Medically screening exam initiated at 9:57 AM.  Appropriate orders placed.  Edwin Miranda was informed that the remainder of the evaluation will be completed by another provider, this initial triage assessment does not replace that evaluation, and the importance of remaining in the ED until their evaluation is complete.  Workup initiated   Stephania Fragmin,  PA-C 10/05/21 1000

## 2021-10-05 NOTE — ED Provider Notes (Signed)
[facility] EMERGENCY DEPARTMENT Provider Note   CSN: [id number] Arrival date & time: 10/05/21  2703     History  Chief Complaint  Patient presents with   Emesis    Edwin Miranda is a 31 y.o. male with no provided medical history.  The patient presents to ED for evaluation of "feeling off" since Friday with associated nausea, malaise and headache.  Patient reports that headaches have been on and off since Friday, worse in the morning.  Patient also endorsing nausea, lack of appetite along with sensitivity to light.  Patient thought that he was possibly dehydrated and has been treating this at home with fluids and Gatorade.  Patient denies taking any Tylenol or ibuprofen.  Patient also states that he feels as if his head and neck have a "pressure" about them however he cannot specify a certain area that this pressure is localizing in.  Patient denies any sick contacts. Patient denies any previous history of hypertension however he is hypertensive today.  Patient denies any previous history of migraines or headaches.  The patient's mother is at the bedside and she states that she suffered from very similar headaches as a child.  Patient denies any unilateral weakness, numbness, confusion, lightheadedness.  The patient denies any chest pain, shortness of breath.  Patient states that he smokes marijuana daily.  Patient adds at end of conversation that he believes he had a subjective fever yesterday at home, this was not recorded with thermometer.   Emesis Associated symptoms: chills, fever and headaches   Associated symptoms: no abdominal pain and no diarrhea        Home Medications Prior to Admission medications   Medication Sig Start Date End Date Taking? Authorizing Provider  ibuprofen (ADVIL,MOTRIN) 200 MG tablet Take 200 mg by mouth 2 (two) times daily as needed for moderate pain.   Yes [provider]  ondansetron (ZOFRAN) 4 MG tablet Take 1 tablet (4 mg  total) by mouth every 8 (eight) hours as needed for nausea or vomiting. 10/05/21  Yes Al Decant, PA-C  clindamycin (CLEOCIN) 300 MG capsule Take 1 capsule (300 mg total) by mouth 4 (four) times daily. X 7 days Patient not taking: Reported on 10/05/2021 07/06/14   Mancel Bale, MD  naproxen (NAPROSYN) 500 MG tablet Take 1 tablet (500 mg total) by mouth 2 (two) times daily with a meal. Patient not taking: Reported on 10/05/2021 02/09/18   Caccavale, Sophia, PA-C      Allergies    Patient has no known allergies.    Review of Systems   Review of Systems  Constitutional:  Positive for chills and fever.  Eyes:  Negative for visual disturbance.  Respiratory:  Negative for shortness of breath.   Cardiovascular:  Negative for chest pain.  Gastrointestinal:  Positive for nausea and vomiting. Negative for abdominal pain and diarrhea.  Musculoskeletal:  Positive for neck pain.  Neurological:  Positive for headaches. Negative for dizziness, weakness, light-headedness and numbness.  All other systems reviewed and are negative.   Physical Exam Updated Vital Signs BP (!) 137/93 (BP Location: Left Arm)   Pulse 86   Temp (!) 97.5 F (36.4 C) (Oral)   Resp 18   Ht 5\' 2"  (1.575 m)   Wt 68 kg   SpO2 97%   BMI 27.44 kg/m  Physical Exam Vitals and nursing note reviewed.  Constitutional:      General: He is not in acute distress.    Appearance: Normal appearance.  He is not ill-appearing, toxic-appearing or diaphoretic.  HENT:     Head: Normocephalic and atraumatic.     Nose: Nose normal. No congestion.     Mouth/Throat:     Mouth: Mucous membranes are moist.     Pharynx: Oropharynx is clear. No oropharyngeal exudate or posterior oropharyngeal erythema.  Eyes:     Extraocular Movements: Extraocular movements intact.     Conjunctiva/sclera: Conjunctivae normal.     Pupils: Pupils are equal, round, and reactive to light.  Cardiovascular:     Rate and Rhythm: Normal rate and regular  rhythm.  Pulmonary:     Effort: Pulmonary effort is normal.     Breath sounds: Normal breath sounds. No wheezing.  Abdominal:     General: Abdomen is flat. Bowel sounds are normal.     Palpations: Abdomen is soft.     Tenderness: There is no abdominal tenderness.  Musculoskeletal:     Cervical back: Normal range of motion and neck supple. No tenderness.  Skin:    General: Skin is warm and dry.     Capillary Refill: Capillary refill takes less than 2 seconds.  Neurological:     Mental Status: He is alert and oriented to person, place, and time.     GCS: GCS eye subscore is 4. GCS verbal subscore is 5. GCS motor subscore is 6.     Cranial Nerves: Cranial nerves 2-12 are intact. No cranial nerve deficit.     Sensory: Sensation is intact. No sensory deficit.     Motor: Motor function is intact. No weakness.     Coordination: Coordination is intact. Heel to Shin Test normal.     ED Results / Procedures / Treatments   Labs (all labs ordered are listed, but only abnormal results are displayed) Labs Reviewed  BASIC METABOLIC PANEL - Abnormal; Notable for the following components:      Result Value   Potassium 3.3 (*)    CO2 21 (*)    Glucose, Bld 111 (*)    All other components within normal limits  URINALYSIS, ROUTINE W REFLEX MICROSCOPIC - Abnormal; Notable for the following components:   Ketones, ur 20 (*)    All other components within normal limits  CBC    EKG None  Radiology CT Head Wo Contrast  Result Date: 10/05/2021 CLINICAL DATA:  Severe headaches with vomiting. EXAM: CT HEAD WITHOUT CONTRAST TECHNIQUE: Contiguous axial images were obtained from the base of the skull through the vertex without intravenous contrast. RADIATION DOSE REDUCTION: This exam was performed according to the departmental dose-optimization program which includes automated exposure control, adjustment of the mA and/or kV according to patient size and/or use of iterative reconstruction technique.  COMPARISON:  July 06 2014 maxillofacial CT. FINDINGS: Brain: No evidence of acute infarction, hemorrhage, hydrocephalus, extra-axial collection or mass lesion/mass effect. Vascular: No hyperdense vessel is noted. Skull: Normal. Negative for fracture or focal lesion. Sinuses/Orbits: No acute finding. Other: None. IMPRESSION: No focal acute intracranial abnormality identified. Electronically Signed   By: Sherian Rein M.D.   On: 10/05/2021 11:32    Procedures Procedures   Medications Ordered in ED Medications  ondansetron (ZOFRAN-ODT) disintegrating tablet 4 mg (4 mg Oral Given 10/05/21 1002)  acetaminophen (TYLENOL) tablet 1,000 mg (1,000 mg Oral Given 10/05/21 1002)  prochlorperazine (COMPAZINE) injection 10 mg (10 mg Intravenous Given 10/05/21 1305)  sodium chloride 0.9 % bolus 1,000 mL (0 mLs Intravenous Stopped 10/05/21 1404)  diphenhydrAMINE (BENADRYL) injection 12.5 mg (12.5 mg Intravenous  Given 10/05/21 1305)  amLODipine (NORVASC) tablet 5 mg (5 mg Oral Given 10/05/21 1257)  potassium chloride SA (KLOR-CON M) CR tablet 40 mEq (40 mEq Oral Given 10/05/21 1256)    ED Course/ Medical Decision Making/ A&P                           Medical Decision Making Risk Prescription drug management.   32 year old male presents to ED for evaluation.  Please see HPI for further details.  On examination, the patient is afebrile and nontachycardic.  The patient lung sounds are clear bilaterally and he is not hypoxic on room air.  The patient's abdomen is soft and compressible all 4 quadrants.  The patient has appropriate range of motion to his neck.  The patient's neurological examination shows no focal neurodeficits, the patient's pupils are PERRL.  The patient is nontoxic in appearance.  Patient worked up utilizing the following labs and imaging studies interpreted by me personally: - CBC unremarkable - BMP shows decreased potassium to 3.3 treated with 40 mEq of potassium chloride - Urinalysis  pending - CT head does not show any intracranial abnormality, herniation, midline shift.  I agree with radiologist interpretation  Patient treated with 10 mg Compazine, 12 and half milligrams Benadryl for headache.  Patient treated with a 5 mg amlodipine for hypertension.  Patient given 1 L NS due to fluid loss, 4 mg Zofran for nausea.  Patient reports after receiving his medications and fluid he feels much better.  At this time, the patient stable for discharge.  The patient will be advised to follow-up with PCP that I have referred him to.  The patient was given return precautions and he voiced understanding with these.  The patient had all of his questions answered to his satisfaction.  The patient is stable at this time for discharge home.   Final Clinical Impression(s) / ED Diagnoses Final diagnoses:  Nausea and vomiting, unspecified vomiting type  Nonintractable headache, unspecified chronicity pattern, unspecified headache type    Rx / DC Orders ED Discharge Orders          Ordered    ondansetron (ZOFRAN) 4 MG tablet  Every 8 hours PRN        10/05/21 1441              Al Decant, PA-C 10/05/21 1449    Pricilla Loveless, MD 10/06/21 [phone]

## 2021-10-05 NOTE — ED Triage Notes (Signed)
Reports headache since Friday and started vomiting today.  Patient complains of pain between neck and head.  Patient complains of pain when opening eyes. Photosensitivity

## 2021-10-19 ENCOUNTER — Inpatient Hospital Stay: Payer: Medicaid Other | Admitting: Nurse Practitioner

## 2021-11-16 ENCOUNTER — Inpatient Hospital Stay: Payer: Medicaid Other | Admitting: Internal Medicine

## 2022-03-19 DIAGNOSIS — Z419 Encounter for procedure for purposes other than remedying health state, unspecified: Secondary | ICD-10-CM | POA: Diagnosis not present

## 2022-04-19 DIAGNOSIS — Z419 Encounter for procedure for purposes other than remedying health state, unspecified: Secondary | ICD-10-CM | POA: Diagnosis not present

## 2022-05-20 DIAGNOSIS — Z419 Encounter for procedure for purposes other than remedying health state, unspecified: Secondary | ICD-10-CM | POA: Diagnosis not present

## 2022-06-15 ENCOUNTER — Telehealth: Payer: Self-pay

## 2022-06-15 NOTE — Telephone Encounter (Signed)
Mychart msg sent

## 2022-06-18 DIAGNOSIS — Z419 Encounter for procedure for purposes other than remedying health state, unspecified: Secondary | ICD-10-CM | POA: Diagnosis not present

## 2022-07-19 DIAGNOSIS — Z419 Encounter for procedure for purposes other than remedying health state, unspecified: Secondary | ICD-10-CM | POA: Diagnosis not present

## 2022-12-30 IMAGING — CT CT ABD-PELV W/O CM
2 of 4 series · 16 of 46 positions shown, 18 images · non-contrast
Comparison: CT abdomen dated 01/15/2014.

CLINICAL DATA: Acute abdominal pain, lower abdominal pain with
nausea and vomiting.

EXAM:
CT ABDOMEN AND PELVIS WITHOUT CONTRAST
TECHNIQUE: Multidetector CT imaging of the abdomen and pelvis was performed
following the standard protocol without IV contrast.

[Series 3: a/p w/o 5mm · axial · non-contrast · 0.73mm/px · z∈[+721,+1161]mm · 13 of 96 slices shown, 15 images]
[im 4/96  soft-tissue]
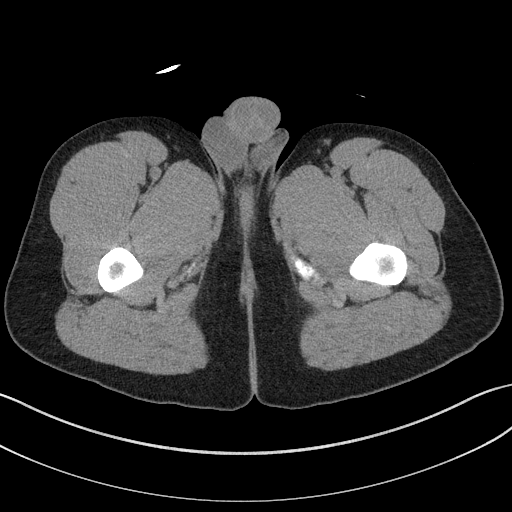
[im 4/96  bone]
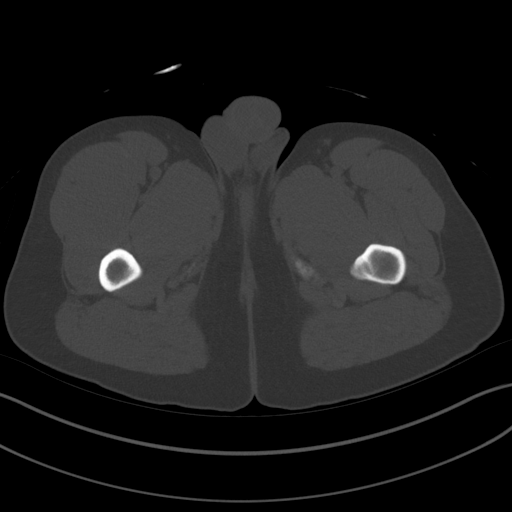
[im 12/96  soft-tissue]
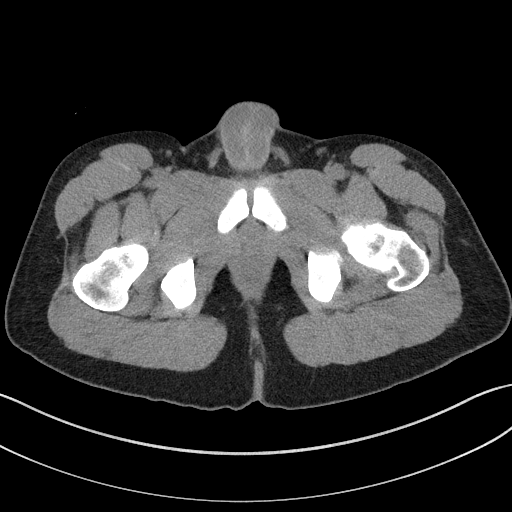
[im 20/96  soft-tissue]
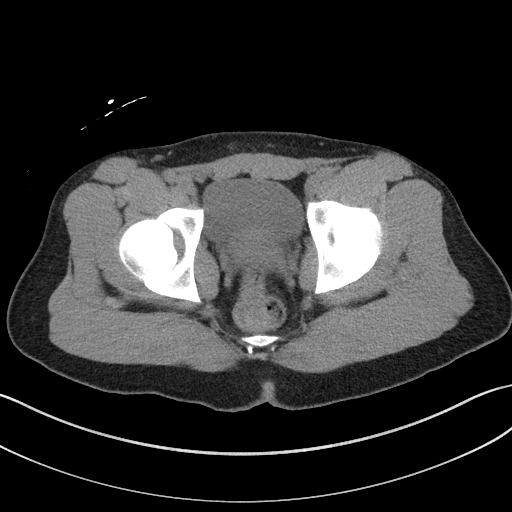
[im 27/96  soft-tissue]
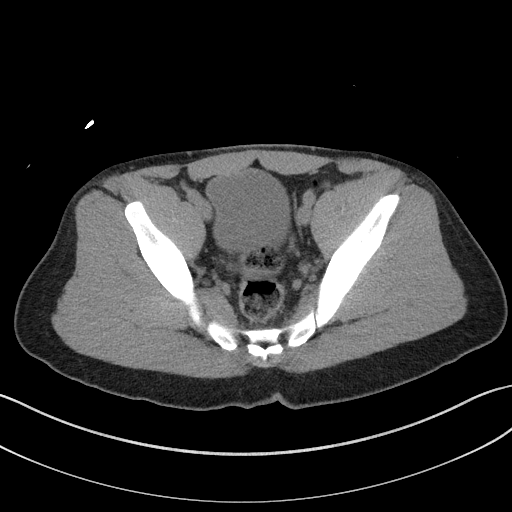
[im 35/96  soft-tissue]
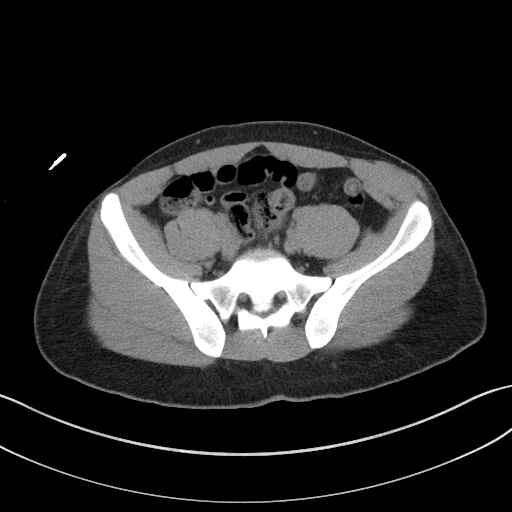
[im 42/96  soft-tissue]
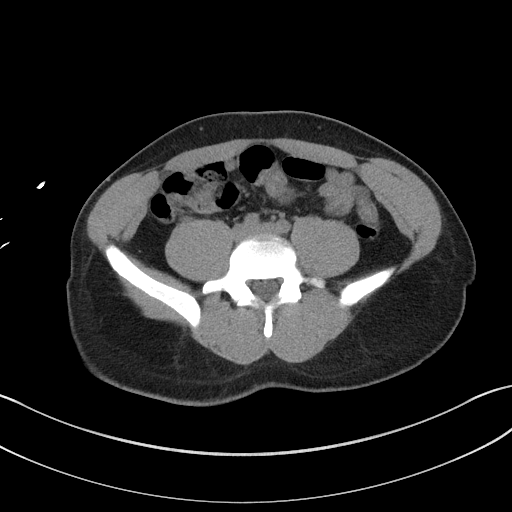
[im 50/96  soft-tissue]
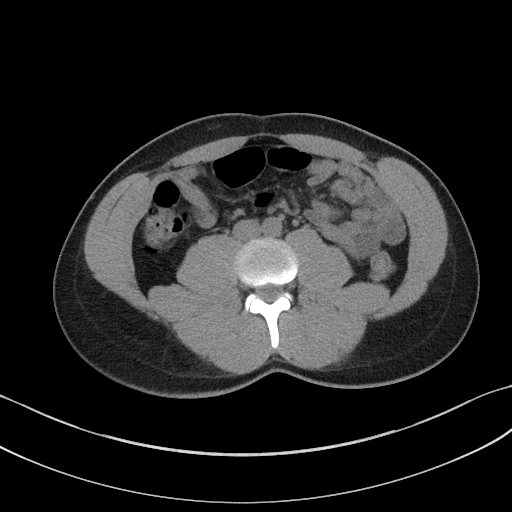
[im 54/96  soft-tissue]
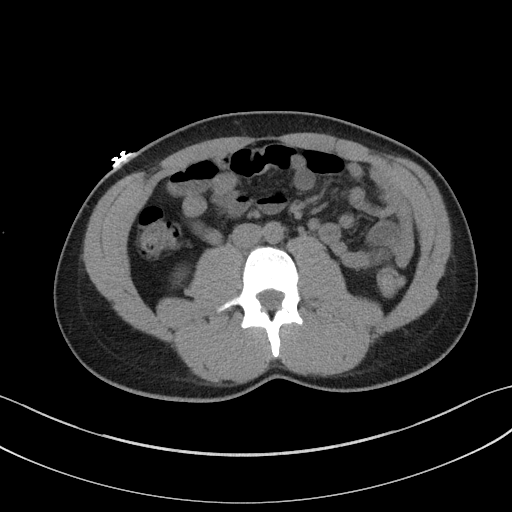
[im 61/96  soft-tissue]
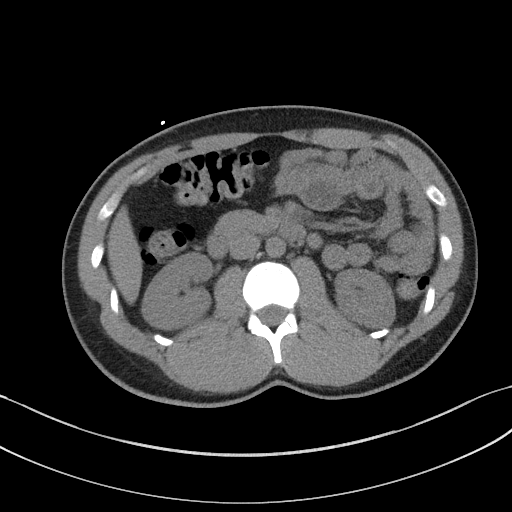
[im 61/96  bone]
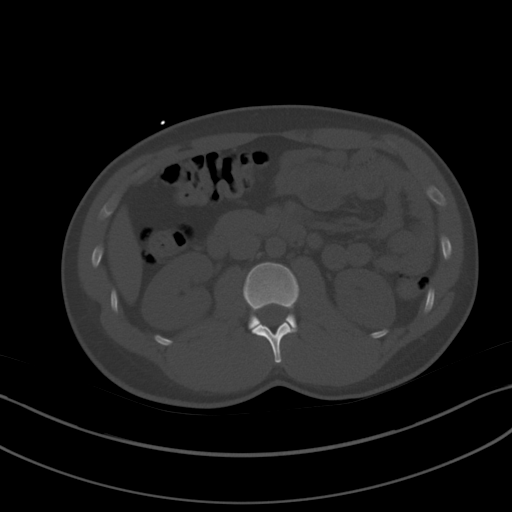
[im 69/96  soft-tissue]
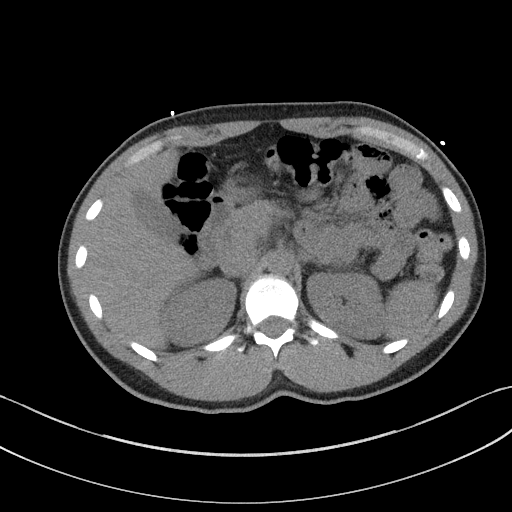
[im 77/96  soft-tissue]
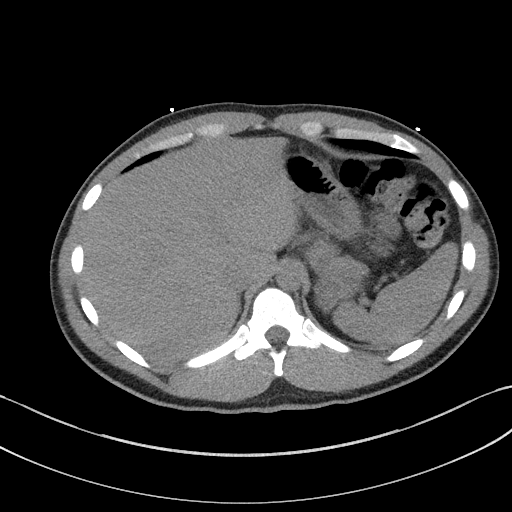
[im 84/96  soft-tissue]
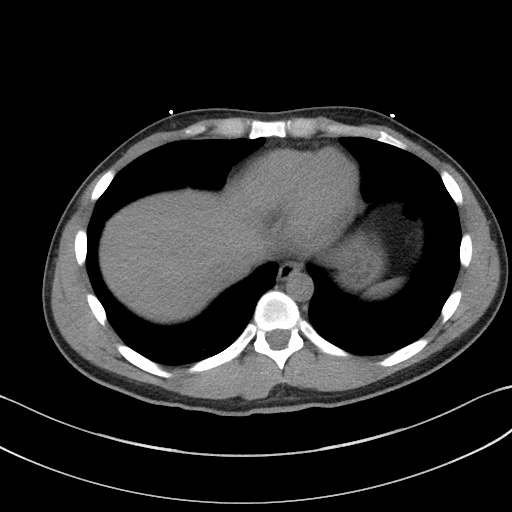
[im 92/96  soft-tissue]
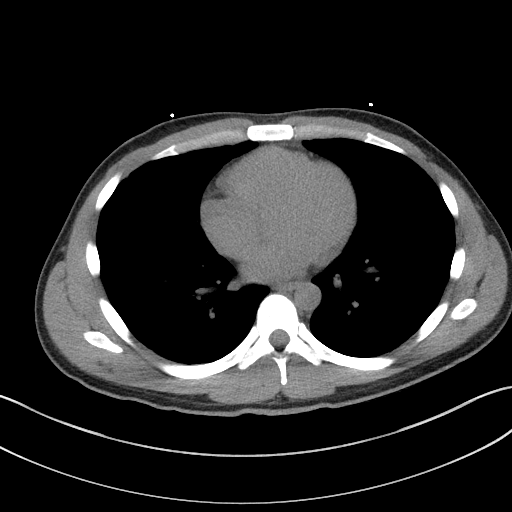

[Series 6: a/p w/o cor · coronal · non-contrast · 0.73mm/px · 3 of 127 slices shown]
[im 43/127  soft-tissue]
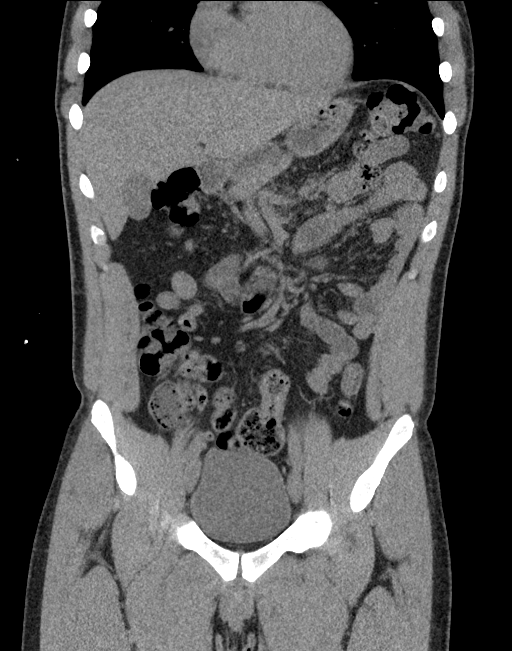
[im 57/127  soft-tissue]
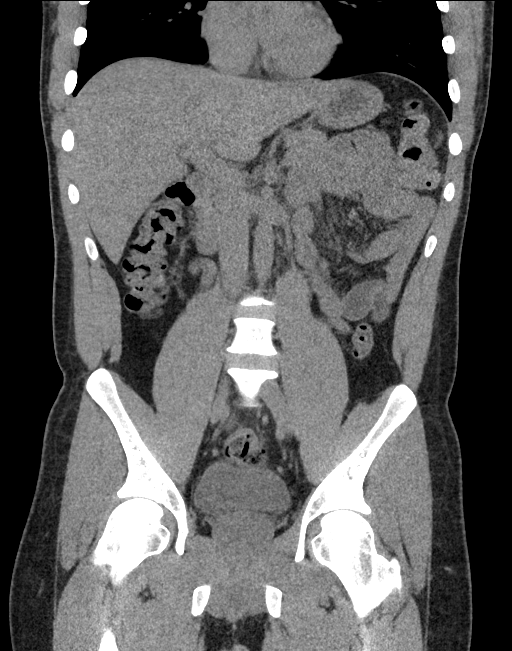
[im 71/127  soft-tissue]
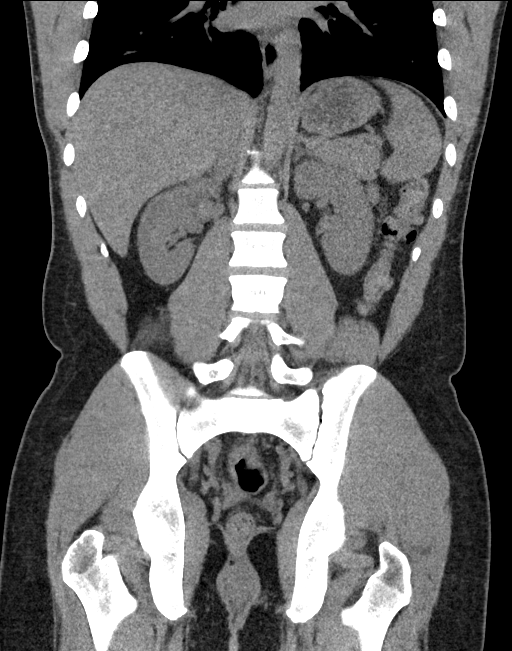

[16 of 46 positions shown; findings below may reference images not displayed]

FINDINGS: Lower chest: No acute abnormality.

Hepatobiliary: No focal liver abnormality is seen. Gallbladder
appears normal. No bile duct dilatation.

Pancreas: Unremarkable. No pancreatic ductal dilatation or
surrounding inflammatory changes.

Spleen: Normal in size without focal abnormality.

Adrenals/Urinary Tract: Adrenal glands appear normal. Kidneys are
unremarkable without stone or hydronephrosis. No perinephric fluid.
No ureteral or bladder calculi are identified. Bladder is
unremarkable, partially decompressed.

Stomach/Bowel: No dilated large or small bowel loops. No evidence of
bowel wall inflammation. Stomach is unremarkable, partially
decompressed. Appendix appears normal. Moderate amount of stool and
gas within the nondistended colon.

Vascular/Lymphatic: No significant vascular findings are present. No
enlarged abdominal or pelvic lymph nodes.

Reproductive: Prostate is unremarkable.

Other: No free fluid or abscess collection. No free intraperitoneal
air.

Musculoskeletal: Osseous structures are unremarkable. Superficial
soft tissues of the abdomen and pelvis are unremarkable.
IMPRESSION: No acute findings within the abdomen or pelvis. No bowel obstruction
or evidence of bowel wall inflammation. No evidence of acute solid
organ abnormality. No renal or ureteral calculi. Appendix appears
normal. Moderate amount of stool and gas within the colon
(constipation?).

## 2024-02-05 IMAGING — CT CT HEAD W/O CM
4 series · 16 of 47 positions shown, 18 images · non-contrast
Comparison: July 06, 2014 maxillofacial CT.

CLINICAL DATA: Severe headaches with vomiting.



[Series 3: head bone · axial · 0.50mm/px · z∈[+1166,+1198]mm · 3 of 82 slices shown]
[im 9/82  bone]
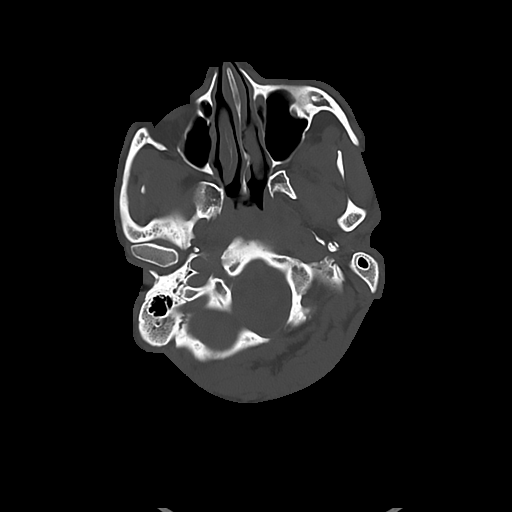
[im 17/82  bone]
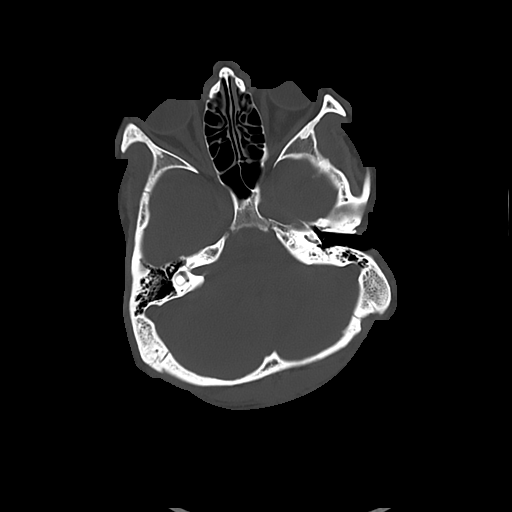
[im 25/82  bone]
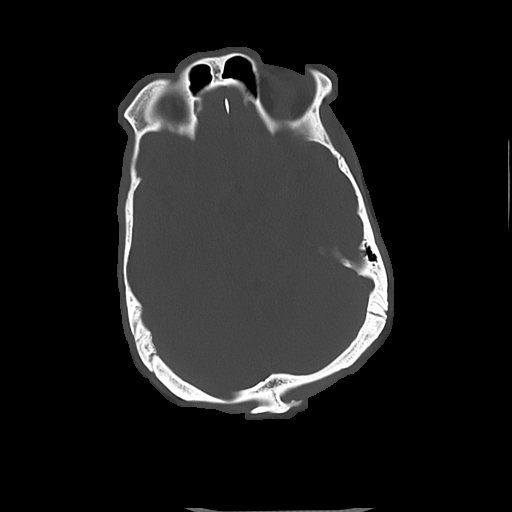

[Series 4: head wo · axial · 0.50mm/px · z∈[+1170,+1290]mm · 7 of 33 slices shown, 9 images]
[im 5/33  brain]
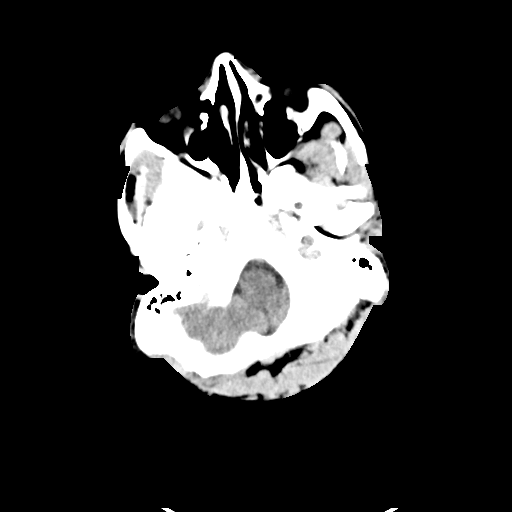
[im 5/33  bone]
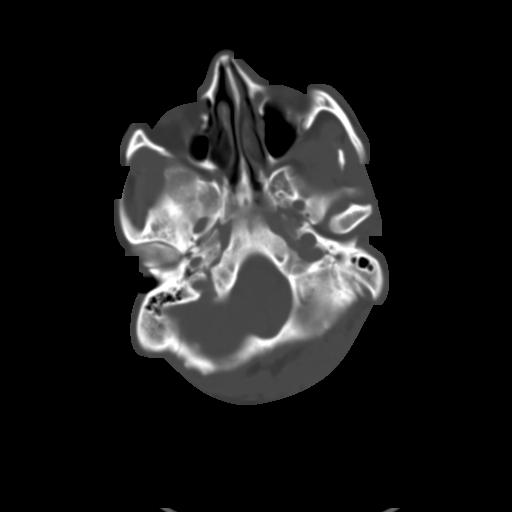
[im 9/33  brain]
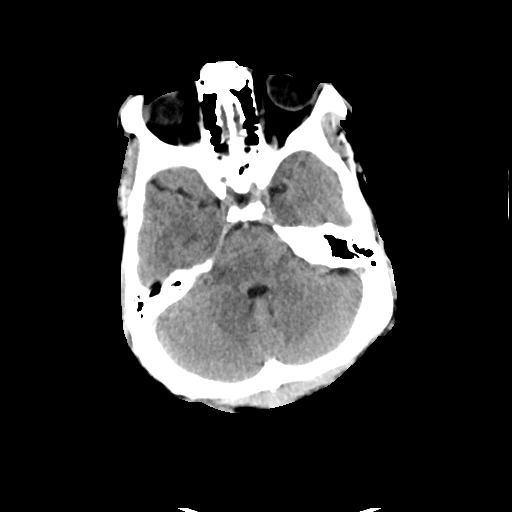
[im 13/33  brain]
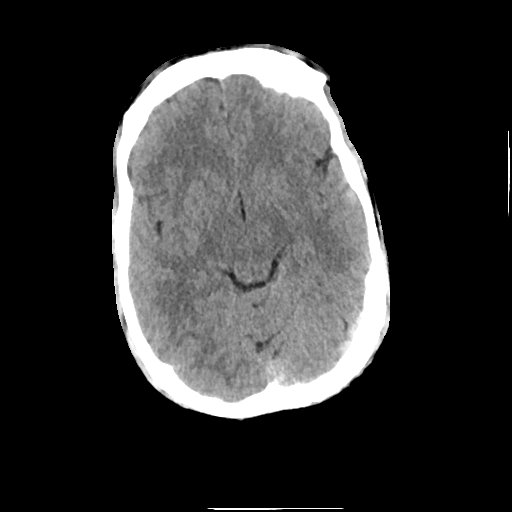
[im 17/33  brain]
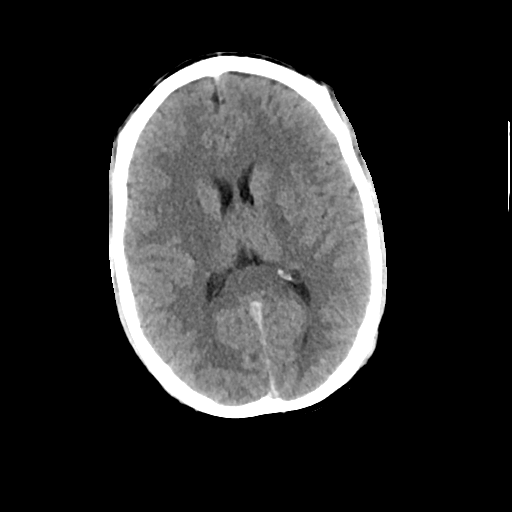
[im 21/33  brain]
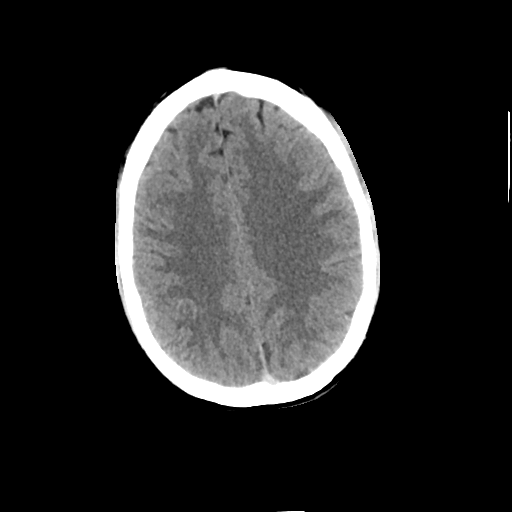
[im 21/33  bone]
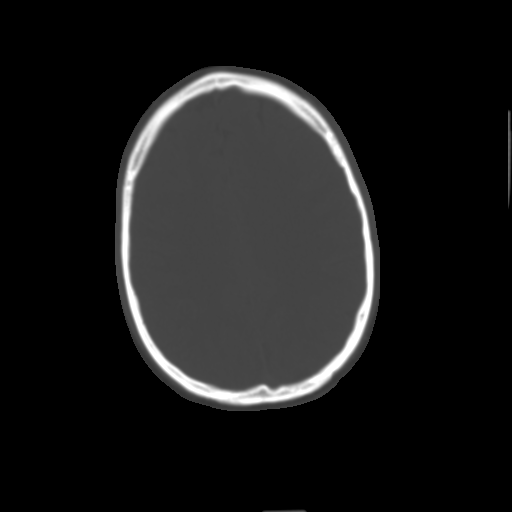
[im 25/33  brain]
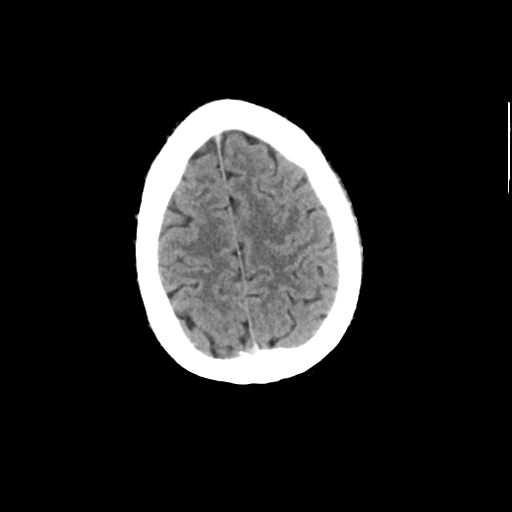
[im 29/33  brain]
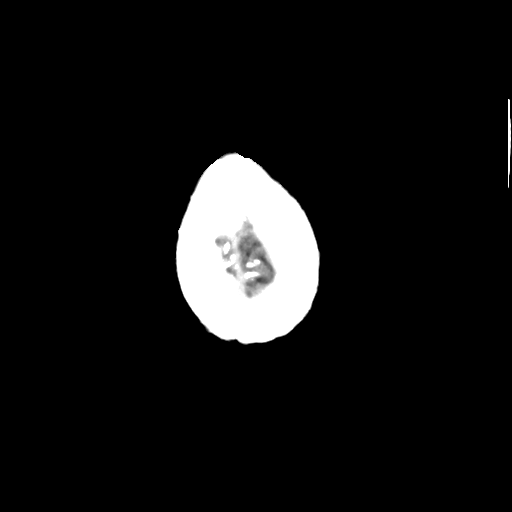

[Series 5: cor soft · coronal · 0.32mm/px · 3 of 73 slices shown]
[im 25/73  brain]
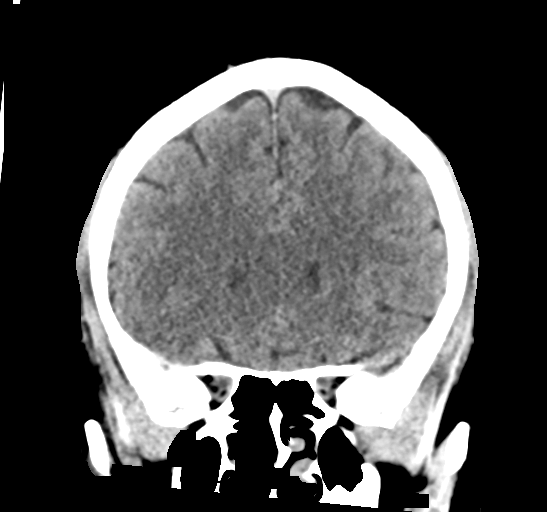
[im 33/73  brain]
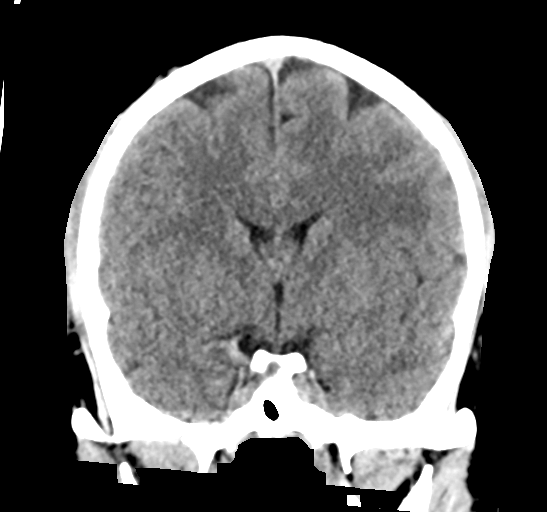
[im 41/73  brain]
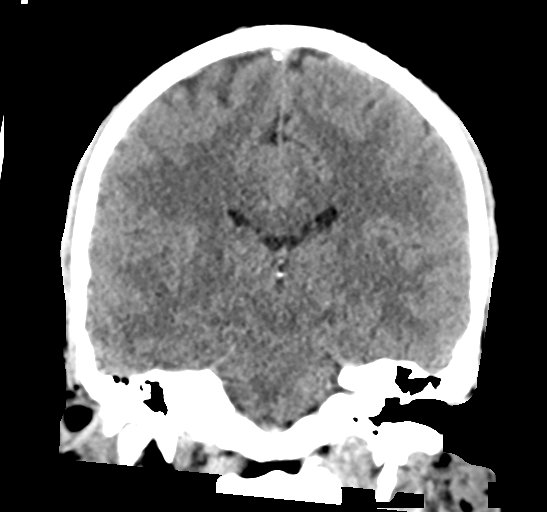

[Series 6: sag soft · sagittal · 0.33mm/px · 3 of 56 slices shown]
[im 19/56  brain]
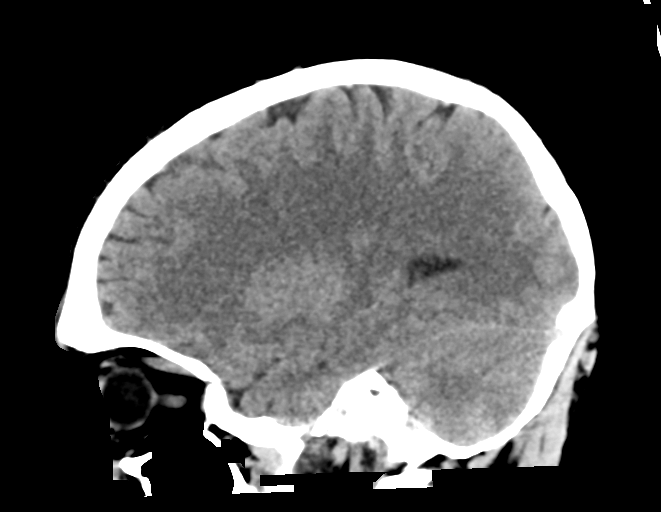
[im 28/56  brain]
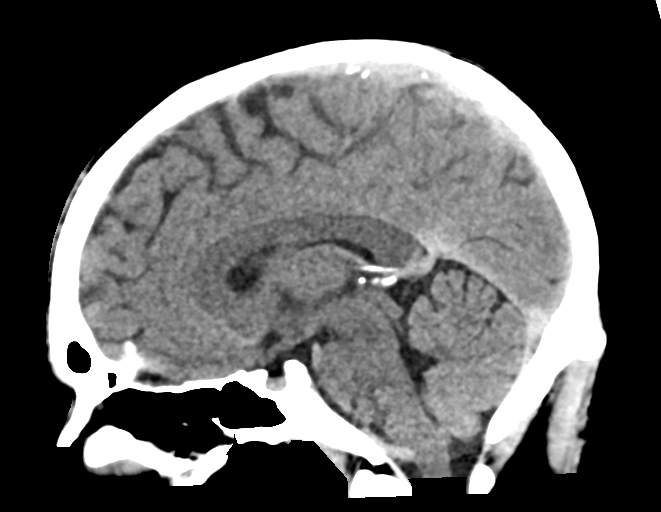
[im 37/56  brain]
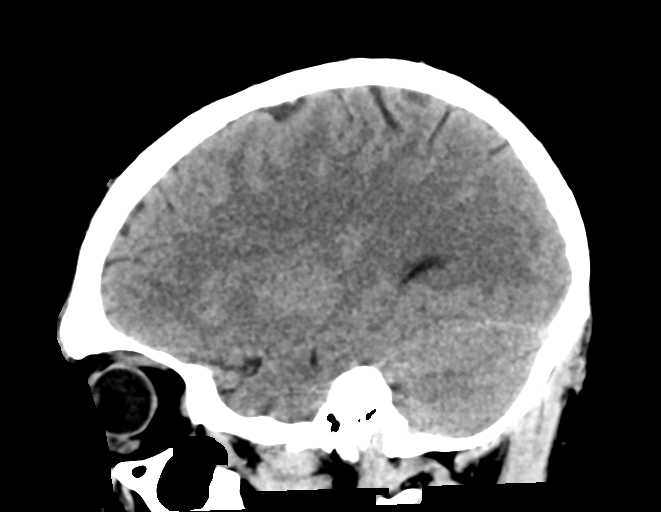

[16 of 47 positions shown; findings below may reference images not displayed]

FINDINGS: Brain: No evidence of acute infarction, hemorrhage, hydrocephalus,
extra-axial collection or mass lesion/mass effect.

Vascular: No hyperdense vessel is noted.

Skull: Normal. Negative for fracture or focal lesion.

Sinuses/Orbits: No acute finding.

Other: None.
IMPRESSION: No focal acute intracranial abnormality identified.
